# Patient Record
Sex: Male | Born: 1935 | Race: White | Hispanic: No | Marital: Married | Smoking: Never smoker
Health system: Southern US, Community
[De-identification: ages and names within clinical notes are randomized; demographics above are authoritative.]

## PROBLEM LIST (undated history)

## (undated) DIAGNOSIS — N400 Enlarged prostate without lower urinary tract symptoms: Secondary | ICD-10-CM

## (undated) DIAGNOSIS — R911 Solitary pulmonary nodule: Secondary | ICD-10-CM

## (undated) DIAGNOSIS — M1990 Unspecified osteoarthritis, unspecified site: Secondary | ICD-10-CM

## (undated) DIAGNOSIS — E785 Hyperlipidemia, unspecified: Secondary | ICD-10-CM

## (undated) DIAGNOSIS — U071 COVID-19: Secondary | ICD-10-CM

## (undated) HISTORY — PX: HERNIA REPAIR: SHX51

---

## 2014-07-01 DIAGNOSIS — I10 Essential (primary) hypertension: Secondary | ICD-10-CM | POA: Diagnosis not present

## 2014-07-01 DIAGNOSIS — E785 Hyperlipidemia, unspecified: Secondary | ICD-10-CM | POA: Diagnosis not present

## 2014-07-01 DIAGNOSIS — Z6823 Body mass index (BMI) 23.0-23.9, adult: Secondary | ICD-10-CM | POA: Diagnosis not present

## 2014-07-04 DIAGNOSIS — I050 Rheumatic mitral stenosis: Secondary | ICD-10-CM | POA: Diagnosis not present

## 2014-07-04 DIAGNOSIS — I361 Nonrheumatic tricuspid (valve) insufficiency: Secondary | ICD-10-CM | POA: Diagnosis not present

## 2014-07-04 DIAGNOSIS — I351 Nonrheumatic aortic (valve) insufficiency: Secondary | ICD-10-CM | POA: Diagnosis not present

## 2014-12-08 DIAGNOSIS — Z23 Encounter for immunization: Secondary | ICD-10-CM | POA: Diagnosis not present

## 2014-12-30 DIAGNOSIS — H2513 Age-related nuclear cataract, bilateral: Secondary | ICD-10-CM | POA: Diagnosis not present

## 2015-01-02 DIAGNOSIS — Z6823 Body mass index (BMI) 23.0-23.9, adult: Secondary | ICD-10-CM | POA: Diagnosis not present

## 2015-01-02 DIAGNOSIS — I050 Rheumatic mitral stenosis: Secondary | ICD-10-CM | POA: Diagnosis not present

## 2015-01-02 DIAGNOSIS — N401 Enlarged prostate with lower urinary tract symptoms: Secondary | ICD-10-CM | POA: Diagnosis not present

## 2015-01-02 DIAGNOSIS — I10 Essential (primary) hypertension: Secondary | ICD-10-CM | POA: Diagnosis not present

## 2015-01-02 DIAGNOSIS — E785 Hyperlipidemia, unspecified: Secondary | ICD-10-CM | POA: Diagnosis not present

## 2015-01-05 DIAGNOSIS — Z1211 Encounter for screening for malignant neoplasm of colon: Secondary | ICD-10-CM | POA: Diagnosis not present

## 2015-07-01 DIAGNOSIS — N400 Enlarged prostate without lower urinary tract symptoms: Secondary | ICD-10-CM | POA: Diagnosis not present

## 2015-07-01 DIAGNOSIS — M129 Arthropathy, unspecified: Secondary | ICD-10-CM | POA: Diagnosis not present

## 2015-07-01 DIAGNOSIS — I10 Essential (primary) hypertension: Secondary | ICD-10-CM | POA: Diagnosis not present

## 2015-07-01 DIAGNOSIS — E785 Hyperlipidemia, unspecified: Secondary | ICD-10-CM | POA: Diagnosis not present

## 2015-07-01 DIAGNOSIS — I050 Rheumatic mitral stenosis: Secondary | ICD-10-CM | POA: Diagnosis not present

## 2015-07-08 DIAGNOSIS — M5136 Other intervertebral disc degeneration, lumbar region: Secondary | ICD-10-CM | POA: Diagnosis not present

## 2015-07-20 DIAGNOSIS — K4090 Unilateral inguinal hernia, without obstruction or gangrene, not specified as recurrent: Secondary | ICD-10-CM | POA: Diagnosis not present

## 2015-07-20 DIAGNOSIS — Z6822 Body mass index (BMI) 22.0-22.9, adult: Secondary | ICD-10-CM | POA: Diagnosis not present

## 2015-12-21 DIAGNOSIS — R6889 Other general symptoms and signs: Secondary | ICD-10-CM | POA: Diagnosis not present

## 2015-12-24 DIAGNOSIS — Z23 Encounter for immunization: Secondary | ICD-10-CM | POA: Diagnosis not present

## 2016-01-06 DIAGNOSIS — E785 Hyperlipidemia, unspecified: Secondary | ICD-10-CM | POA: Diagnosis not present

## 2016-01-06 DIAGNOSIS — N401 Enlarged prostate with lower urinary tract symptoms: Secondary | ICD-10-CM | POA: Diagnosis not present

## 2016-01-06 DIAGNOSIS — I10 Essential (primary) hypertension: Secondary | ICD-10-CM | POA: Diagnosis not present

## 2016-01-13 DIAGNOSIS — H2513 Age-related nuclear cataract, bilateral: Secondary | ICD-10-CM | POA: Diagnosis not present

## 2016-01-21 DIAGNOSIS — N401 Enlarged prostate with lower urinary tract symptoms: Secondary | ICD-10-CM | POA: Diagnosis not present

## 2016-01-21 DIAGNOSIS — R972 Elevated prostate specific antigen [PSA]: Secondary | ICD-10-CM | POA: Diagnosis not present

## 2016-03-14 DIAGNOSIS — I2699 Other pulmonary embolism without acute cor pulmonale: Secondary | ICD-10-CM

## 2016-03-14 HISTORY — DX: Other pulmonary embolism without acute cor pulmonale: I26.99

## 2016-08-08 DIAGNOSIS — M25511 Pain in right shoulder: Secondary | ICD-10-CM | POA: Diagnosis not present

## 2016-08-09 DIAGNOSIS — M25511 Pain in right shoulder: Secondary | ICD-10-CM | POA: Diagnosis not present

## 2017-05-03 DIAGNOSIS — N401 Enlarged prostate with lower urinary tract symptoms: Secondary | ICD-10-CM | POA: Diagnosis not present

## 2017-05-03 DIAGNOSIS — R972 Elevated prostate specific antigen [PSA]: Secondary | ICD-10-CM | POA: Diagnosis not present

## 2017-05-08 DIAGNOSIS — J018 Other acute sinusitis: Secondary | ICD-10-CM | POA: Diagnosis not present

## 2017-07-10 DIAGNOSIS — N401 Enlarged prostate with lower urinary tract symptoms: Secondary | ICD-10-CM | POA: Diagnosis not present

## 2017-07-10 DIAGNOSIS — E782 Mixed hyperlipidemia: Secondary | ICD-10-CM | POA: Diagnosis not present

## 2017-07-10 DIAGNOSIS — M479 Spondylosis, unspecified: Secondary | ICD-10-CM | POA: Diagnosis not present

## 2017-07-10 DIAGNOSIS — I10 Essential (primary) hypertension: Secondary | ICD-10-CM | POA: Diagnosis not present

## 2017-07-10 DIAGNOSIS — E785 Hyperlipidemia, unspecified: Secondary | ICD-10-CM | POA: Diagnosis not present

## 2017-07-10 DIAGNOSIS — M5136 Other intervertebral disc degeneration, lumbar region: Secondary | ICD-10-CM | POA: Diagnosis not present

## 2017-07-20 DIAGNOSIS — I714 Abdominal aortic aneurysm, without rupture: Secondary | ICD-10-CM | POA: Diagnosis not present

## 2017-10-31 DIAGNOSIS — R972 Elevated prostate specific antigen [PSA]: Secondary | ICD-10-CM | POA: Diagnosis not present

## 2017-10-31 DIAGNOSIS — N401 Enlarged prostate with lower urinary tract symptoms: Secondary | ICD-10-CM | POA: Diagnosis not present

## 2017-11-10 DIAGNOSIS — Z23 Encounter for immunization: Secondary | ICD-10-CM | POA: Diagnosis not present

## 2017-11-10 DIAGNOSIS — I10 Essential (primary) hypertension: Secondary | ICD-10-CM | POA: Diagnosis not present

## 2017-11-10 DIAGNOSIS — M5136 Other intervertebral disc degeneration, lumbar region: Secondary | ICD-10-CM | POA: Diagnosis not present

## 2017-11-10 DIAGNOSIS — E782 Mixed hyperlipidemia: Secondary | ICD-10-CM | POA: Diagnosis not present

## 2017-12-06 DIAGNOSIS — H2513 Age-related nuclear cataract, bilateral: Secondary | ICD-10-CM | POA: Diagnosis not present

## 2018-03-21 DIAGNOSIS — I10 Essential (primary) hypertension: Secondary | ICD-10-CM | POA: Diagnosis not present

## 2018-03-21 DIAGNOSIS — E782 Mixed hyperlipidemia: Secondary | ICD-10-CM | POA: Diagnosis not present

## 2018-03-21 DIAGNOSIS — Z6823 Body mass index (BMI) 23.0-23.9, adult: Secondary | ICD-10-CM | POA: Diagnosis not present

## 2018-03-21 DIAGNOSIS — M5136 Other intervertebral disc degeneration, lumbar region: Secondary | ICD-10-CM | POA: Diagnosis not present

## 2018-07-20 DIAGNOSIS — E782 Mixed hyperlipidemia: Secondary | ICD-10-CM | POA: Diagnosis not present

## 2018-07-20 DIAGNOSIS — I10 Essential (primary) hypertension: Secondary | ICD-10-CM | POA: Diagnosis not present

## 2018-07-20 DIAGNOSIS — M5136 Other intervertebral disc degeneration, lumbar region: Secondary | ICD-10-CM | POA: Diagnosis not present

## 2018-07-20 DIAGNOSIS — I342 Nonrheumatic mitral (valve) stenosis: Secondary | ICD-10-CM | POA: Diagnosis not present

## 2018-07-24 DIAGNOSIS — I714 Abdominal aortic aneurysm, without rupture: Secondary | ICD-10-CM | POA: Diagnosis not present

## 2018-11-13 DIAGNOSIS — I10 Essential (primary) hypertension: Secondary | ICD-10-CM | POA: Diagnosis not present

## 2018-11-13 DIAGNOSIS — E782 Mixed hyperlipidemia: Secondary | ICD-10-CM | POA: Diagnosis not present

## 2018-11-13 DIAGNOSIS — I714 Abdominal aortic aneurysm, without rupture: Secondary | ICD-10-CM | POA: Diagnosis not present

## 2018-11-13 DIAGNOSIS — M5136 Other intervertebral disc degeneration, lumbar region: Secondary | ICD-10-CM | POA: Diagnosis not present

## 2018-12-25 DIAGNOSIS — H2513 Age-related nuclear cataract, bilateral: Secondary | ICD-10-CM | POA: Diagnosis not present

## 2019-02-13 DIAGNOSIS — R509 Fever, unspecified: Secondary | ICD-10-CM | POA: Diagnosis not present

## 2019-02-13 DIAGNOSIS — R05 Cough: Secondary | ICD-10-CM | POA: Diagnosis not present

## 2019-02-13 DIAGNOSIS — M791 Myalgia, unspecified site: Secondary | ICD-10-CM | POA: Diagnosis not present

## 2019-02-13 DIAGNOSIS — Z1159 Encounter for screening for other viral diseases: Secondary | ICD-10-CM | POA: Diagnosis not present

## 2019-02-17 DIAGNOSIS — R918 Other nonspecific abnormal finding of lung field: Secondary | ICD-10-CM | POA: Diagnosis not present

## 2019-02-17 DIAGNOSIS — R069 Unspecified abnormalities of breathing: Secondary | ICD-10-CM | POA: Diagnosis not present

## 2019-02-17 DIAGNOSIS — J1289 Other viral pneumonia: Secondary | ICD-10-CM | POA: Diagnosis not present

## 2019-02-17 DIAGNOSIS — U071 COVID-19: Secondary | ICD-10-CM | POA: Diagnosis not present

## 2019-02-17 DIAGNOSIS — Z743 Need for continuous supervision: Secondary | ICD-10-CM | POA: Diagnosis not present

## 2019-02-17 DIAGNOSIS — J9601 Acute respiratory failure with hypoxia: Secondary | ICD-10-CM | POA: Diagnosis not present

## 2019-02-17 DIAGNOSIS — Z4682 Encounter for fitting and adjustment of non-vascular catheter: Secondary | ICD-10-CM | POA: Diagnosis not present

## 2019-02-17 DIAGNOSIS — R0902 Hypoxemia: Secondary | ICD-10-CM | POA: Diagnosis not present

## 2019-02-17 DIAGNOSIS — R0602 Shortness of breath: Secondary | ICD-10-CM | POA: Diagnosis not present

## 2019-02-18 ENCOUNTER — Inpatient Hospital Stay (HOSPITAL_COMMUNITY): Payer: Medicare Other

## 2019-02-18 ENCOUNTER — Inpatient Hospital Stay (HOSPITAL_COMMUNITY)
Admission: AD | Admit: 2019-02-18 | Discharge: 2019-03-15 | DRG: 207 | Disposition: E | Payer: Medicare Other | Source: Other Acute Inpatient Hospital | Attending: Pulmonary Disease | Admitting: Pulmonary Disease

## 2019-02-18 ENCOUNTER — Encounter (HOSPITAL_COMMUNITY): Payer: Self-pay | Admitting: Pulmonary Disease

## 2019-02-18 DIAGNOSIS — E785 Hyperlipidemia, unspecified: Secondary | ICD-10-CM | POA: Diagnosis not present

## 2019-02-18 DIAGNOSIS — I4892 Unspecified atrial flutter: Secondary | ICD-10-CM | POA: Diagnosis not present

## 2019-02-18 DIAGNOSIS — E870 Hyperosmolality and hypernatremia: Secondary | ICD-10-CM | POA: Diagnosis not present

## 2019-02-18 DIAGNOSIS — J80 Acute respiratory distress syndrome: Secondary | ICD-10-CM | POA: Diagnosis not present

## 2019-02-18 DIAGNOSIS — I472 Ventricular tachycardia: Secondary | ICD-10-CM | POA: Diagnosis not present

## 2019-02-18 DIAGNOSIS — R911 Solitary pulmonary nodule: Secondary | ICD-10-CM | POA: Diagnosis present

## 2019-02-18 DIAGNOSIS — R6521 Severe sepsis with septic shock: Secondary | ICD-10-CM | POA: Diagnosis not present

## 2019-02-18 DIAGNOSIS — Z86711 Personal history of pulmonary embolism: Secondary | ICD-10-CM | POA: Diagnosis not present

## 2019-02-18 DIAGNOSIS — A419 Sepsis, unspecified organism: Secondary | ICD-10-CM | POA: Diagnosis not present

## 2019-02-18 DIAGNOSIS — R339 Retention of urine, unspecified: Secondary | ICD-10-CM | POA: Diagnosis not present

## 2019-02-18 DIAGNOSIS — A4189 Other specified sepsis: Secondary | ICD-10-CM | POA: Diagnosis not present

## 2019-02-18 DIAGNOSIS — E875 Hyperkalemia: Secondary | ICD-10-CM | POA: Diagnosis not present

## 2019-02-18 DIAGNOSIS — J939 Pneumothorax, unspecified: Secondary | ICD-10-CM | POA: Diagnosis not present

## 2019-02-18 DIAGNOSIS — T380X5A Adverse effect of glucocorticoids and synthetic analogues, initial encounter: Secondary | ICD-10-CM | POA: Diagnosis not present

## 2019-02-18 DIAGNOSIS — Z880 Allergy status to penicillin: Secondary | ICD-10-CM | POA: Diagnosis not present

## 2019-02-18 DIAGNOSIS — Z66 Do not resuscitate: Secondary | ICD-10-CM | POA: Diagnosis not present

## 2019-02-18 DIAGNOSIS — R34 Anuria and oliguria: Secondary | ICD-10-CM | POA: Diagnosis not present

## 2019-02-18 DIAGNOSIS — R739 Hyperglycemia, unspecified: Secondary | ICD-10-CM | POA: Diagnosis not present

## 2019-02-18 DIAGNOSIS — Y95 Nosocomial condition: Secondary | ICD-10-CM | POA: Diagnosis not present

## 2019-02-18 DIAGNOSIS — J9601 Acute respiratory failure with hypoxia: Secondary | ICD-10-CM

## 2019-02-18 DIAGNOSIS — J181 Lobar pneumonia, unspecified organism: Secondary | ICD-10-CM | POA: Diagnosis not present

## 2019-02-18 DIAGNOSIS — Z9911 Dependence on respirator [ventilator] status: Secondary | ICD-10-CM | POA: Diagnosis not present

## 2019-02-18 DIAGNOSIS — J1289 Other viral pneumonia: Secondary | ICD-10-CM

## 2019-02-18 DIAGNOSIS — Z4682 Encounter for fitting and adjustment of non-vascular catheter: Secondary | ICD-10-CM | POA: Diagnosis not present

## 2019-02-18 DIAGNOSIS — J156 Pneumonia due to other aerobic Gram-negative bacteria: Secondary | ICD-10-CM | POA: Diagnosis not present

## 2019-02-18 DIAGNOSIS — Z452 Encounter for adjustment and management of vascular access device: Secondary | ICD-10-CM | POA: Diagnosis not present

## 2019-02-18 DIAGNOSIS — R001 Bradycardia, unspecified: Secondary | ICD-10-CM | POA: Diagnosis not present

## 2019-02-18 DIAGNOSIS — R918 Other nonspecific abnormal finding of lung field: Secondary | ICD-10-CM | POA: Diagnosis not present

## 2019-02-18 DIAGNOSIS — I952 Hypotension due to drugs: Secondary | ICD-10-CM | POA: Diagnosis not present

## 2019-02-18 DIAGNOSIS — Z7189 Other specified counseling: Secondary | ICD-10-CM

## 2019-02-18 DIAGNOSIS — U071 COVID-19: Principal | ICD-10-CM | POA: Diagnosis present

## 2019-02-18 DIAGNOSIS — Z515 Encounter for palliative care: Secondary | ICD-10-CM | POA: Diagnosis not present

## 2019-02-18 DIAGNOSIS — N400 Enlarged prostate without lower urinary tract symptoms: Secondary | ICD-10-CM | POA: Diagnosis present

## 2019-02-18 HISTORY — DX: Benign prostatic hyperplasia without lower urinary tract symptoms: N40.0

## 2019-02-18 HISTORY — DX: Solitary pulmonary nodule: R91.1

## 2019-02-18 HISTORY — DX: COVID-19: U07.1

## 2019-02-18 HISTORY — DX: Hyperlipidemia, unspecified: E78.5

## 2019-02-18 HISTORY — DX: Unspecified osteoarthritis, unspecified site: M19.90

## 2019-02-18 LAB — HEPATITIS PANEL, ACUTE
HCV Ab: NONREACTIVE
Hep A IgM: NONREACTIVE
Hep B C IgM: NONREACTIVE
Hepatitis B Surface Ag: NONREACTIVE

## 2019-02-18 LAB — POCT I-STAT 7, (LYTES, BLD GAS, ICA,H+H)
Bicarbonate: 27 mmol/L (ref 20.0–28.0)
Calcium, Ion: 1.22 mmol/L (ref 1.15–1.40)
HCT: 37 % — ABNORMAL LOW (ref 39.0–52.0)
Hemoglobin: 12.6 g/dL — ABNORMAL LOW (ref 13.0–17.0)
O2 Saturation: 98 %
Patient temperature: 98.3
Potassium: 4.8 mmol/L (ref 3.5–5.1)
Sodium: 144 mmol/L (ref 135–145)
TCO2: 29 mmol/L (ref 22–32)
pCO2 arterial: 54.4 mmHg — ABNORMAL HIGH (ref 32.0–48.0)
pH, Arterial: 7.302 — ABNORMAL LOW (ref 7.350–7.450)
pO2, Arterial: 110 mmHg — ABNORMAL HIGH (ref 83.0–108.0)

## 2019-02-18 LAB — TROPONIN I (HIGH SENSITIVITY)
Troponin I (High Sensitivity): 229 ng/L (ref <18)
Troponin I (High Sensitivity): 229 ng/L (ref <18)

## 2019-02-18 LAB — FERRITIN: Ferritin: 672 ng/mL — ABNORMAL HIGH (ref 24–336)

## 2019-02-18 LAB — COMPREHENSIVE METABOLIC PANEL
ALT: 19 U/L (ref 0–44)
AST: 39 U/L (ref 15–41)
Albumin: 3 g/dL — ABNORMAL LOW (ref 3.5–5.0)
Alkaline Phosphatase: 40 U/L (ref 38–126)
Anion gap: 11 (ref 5–15)
BUN: 21 mg/dL (ref 8–23)
CO2: 28 mmol/L (ref 22–32)
Calcium: 8.5 mg/dL — ABNORMAL LOW (ref 8.9–10.3)
Chloride: 108 mmol/L (ref 98–111)
Creatinine, Ser: 1.16 mg/dL (ref 0.61–1.24)
GFR calc Af Amer: >60 mL/min (ref >60)
GFR calc non Af Amer: 57 mL/min — ABNORMAL LOW (ref >60)
Glucose, Bld: 129 mg/dL — ABNORMAL HIGH (ref 70–99)
Potassium: 4.9 mmol/L (ref 3.5–5.1)
Sodium: 147 mmol/L — ABNORMAL HIGH (ref 135–145)
Total Bilirubin: 0.5 mg/dL (ref 0.3–1.2)
Total Protein: 6.6 g/dL (ref 6.5–8.1)

## 2019-02-18 LAB — CBC WITH DIFFERENTIAL/PLATELET
Abs Immature Granulocytes: 0.04 10*3/uL (ref 0.00–0.07)
Basophils Absolute: 0 10*3/uL (ref 0.0–0.1)
Basophils Relative: 0 %
Eosinophils Absolute: 0 10*3/uL (ref 0.0–0.5)
Eosinophils Relative: 0 %
HCT: 41.5 % (ref 39.0–52.0)
Hemoglobin: 13.1 g/dL (ref 13.0–17.0)
Immature Granulocytes: 1 %
Lymphocytes Relative: 7 %
Lymphs Abs: 0.5 10*3/uL — ABNORMAL LOW (ref 0.7–4.0)
MCH: 29.8 pg (ref 26.0–34.0)
MCHC: 31.6 g/dL (ref 30.0–36.0)
MCV: 94.5 fL (ref 80.0–100.0)
Monocytes Absolute: 0.5 10*3/uL (ref 0.1–1.0)
Monocytes Relative: 6 %
Neutro Abs: 6.3 10*3/uL (ref 1.7–7.7)
Neutrophils Relative %: 86 %
Platelets: 269 10*3/uL (ref 150–400)
RBC: 4.39 MIL/uL (ref 4.22–5.81)
RDW: 13.3 % (ref 11.5–15.5)
WBC: 7.3 10*3/uL (ref 4.0–10.5)
nRBC: 0 % (ref 0.0–0.2)

## 2019-02-18 LAB — D-DIMER, QUANTITATIVE: D-Dimer, Quant: 0.96 ug/mL-FEU — ABNORMAL HIGH (ref 0.00–0.50)

## 2019-02-18 LAB — HEMOGLOBIN A1C
Hgb A1c MFr Bld: 6.2 % — ABNORMAL HIGH (ref 4.8–5.6)
Mean Plasma Glucose: 131.24 mg/dL

## 2019-02-18 LAB — FIBRINOGEN: Fibrinogen: 679 mg/dL — ABNORMAL HIGH (ref 210–475)

## 2019-02-18 LAB — PROCALCITONIN: Procalcitonin: 0.24 ng/mL

## 2019-02-18 LAB — GLUCOSE, CAPILLARY
Glucose-Capillary: 119 mg/dL — ABNORMAL HIGH (ref 70–99)
Glucose-Capillary: 113 mg/dL — ABNORMAL HIGH (ref 70–99)
Glucose-Capillary: 139 mg/dL — ABNORMAL HIGH (ref 70–99)

## 2019-02-18 LAB — MAGNESIUM: Magnesium: 1.7 mg/dL (ref 1.7–2.4)

## 2019-02-18 LAB — ABO/RH: ABO/RH(D): A POS

## 2019-02-18 LAB — PHOSPHORUS: Phosphorus: 3.8 mg/dL (ref 2.5–4.6)

## 2019-02-18 MED ORDER — INSULIN ASPART 100 UNIT/ML ~~LOC~~ SOLN
0.0000 [IU] | SUBCUTANEOUS | Status: DC
  Administered 2019-02-19 (×3): 1 [IU] via SUBCUTANEOUS
  Administered 2019-02-19: 2 [IU] via SUBCUTANEOUS
  Administered 2019-02-19: 1 [IU] via SUBCUTANEOUS
  Administered 2019-02-19 – 2019-02-20 (×4): 2 [IU] via SUBCUTANEOUS
  Administered 2019-02-20: 1 [IU] via SUBCUTANEOUS
  Administered 2019-02-21: 2 [IU] via SUBCUTANEOUS
  Administered 2019-02-21 (×2): 1 [IU] via SUBCUTANEOUS
  Administered 2019-02-21 – 2019-02-22 (×3): 2 [IU] via SUBCUTANEOUS
  Administered 2019-02-22: 1 [IU] via SUBCUTANEOUS
  Administered 2019-02-22: 3 [IU] via SUBCUTANEOUS
  Administered 2019-02-22: 2 [IU] via SUBCUTANEOUS
  Administered 2019-02-22: 3 [IU] via SUBCUTANEOUS
  Administered 2019-02-22: 1 [IU] via SUBCUTANEOUS
  Administered 2019-02-23: 2 [IU] via SUBCUTANEOUS
  Administered 2019-02-23: 5 [IU] via SUBCUTANEOUS
  Administered 2019-02-23 (×3): 3 [IU] via SUBCUTANEOUS
  Administered 2019-02-24 – 2019-02-25 (×2): 1 [IU] via SUBCUTANEOUS
  Administered 2019-02-25: 2 [IU] via SUBCUTANEOUS
  Administered 2019-02-25: 1 [IU] via SUBCUTANEOUS
  Administered 2019-02-25 – 2019-02-26 (×3): 2 [IU] via SUBCUTANEOUS
  Administered 2019-02-26 (×2): 1 [IU] via SUBCUTANEOUS
  Administered 2019-02-26 – 2019-02-27 (×3): 2 [IU] via SUBCUTANEOUS
  Administered 2019-02-27: 3 [IU] via SUBCUTANEOUS
  Administered 2019-02-27: 2 [IU] via SUBCUTANEOUS
  Administered 2019-02-27: 1 [IU] via SUBCUTANEOUS
  Administered 2019-02-28 (×2): 2 [IU] via SUBCUTANEOUS
  Administered 2019-02-28 – 2019-03-01 (×2): 1 [IU] via SUBCUTANEOUS

## 2019-02-18 MED ORDER — CHLORHEXIDINE GLUCONATE 0.12% ORAL RINSE (MEDLINE KIT)
15.0000 mL | Freq: Two times a day (BID) | OROMUCOSAL | Status: DC
  Administered 2019-02-18 – 2019-03-01 (×22): 15 mL via OROMUCOSAL

## 2019-02-18 MED ORDER — ADULT MULTIVITAMIN LIQUID CH
15.0000 mL | Freq: Every day | ORAL | Status: DC
  Administered 2019-02-18 – 2019-02-28 (×11): 15 mL
  Filled 2019-02-18 (×11): qty 15

## 2019-02-18 MED ORDER — ENOXAPARIN SODIUM 40 MG/0.4ML ~~LOC~~ SOLN
40.0000 mg | Freq: Two times a day (BID) | SUBCUTANEOUS | Status: DC
  Administered 2019-02-18 – 2019-02-28 (×21): 40 mg via SUBCUTANEOUS
  Filled 2019-02-18 (×21): qty 0.4

## 2019-02-18 MED ORDER — ORAL CARE MOUTH RINSE
15.0000 mL | OROMUCOSAL | Status: DC
  Administered 2019-02-18 – 2019-03-01 (×107): 15 mL via OROMUCOSAL

## 2019-02-18 MED ORDER — FAMOTIDINE 40 MG/5ML PO SUSR
20.0000 mg | Freq: Two times a day (BID) | ORAL | Status: DC
  Administered 2019-02-18 – 2019-03-01 (×22): 20 mg
  Filled 2019-02-18 (×22): qty 2.5

## 2019-02-18 MED ORDER — ARTIFICIAL TEARS OPHTHALMIC OINT
1.0000 "application " | TOPICAL_OINTMENT | Freq: Three times a day (TID) | OPHTHALMIC | Status: DC
  Administered 2019-02-19 – 2019-03-01 (×26): 1 via OPHTHALMIC
  Filled 2019-02-18 (×6): qty 3.5

## 2019-02-18 MED ORDER — FENTANYL CITRATE (PF) 100 MCG/2ML IJ SOLN: 25.0000 ug | Freq: Once | INTRAMUSCULAR | Status: DC

## 2019-02-18 MED ORDER — DEXAMETHASONE SODIUM PHOSPHATE 10 MG/ML IJ SOLN
6.0000 mg | INTRAMUSCULAR | Status: AC
  Administered 2019-02-18 – 2019-02-27 (×10): 6 mg via INTRAVENOUS
  Filled 2019-02-18 (×10): qty 1

## 2019-02-18 MED ORDER — FENTANYL 2500MCG IN NS 250ML (10MCG/ML) PREMIX INFUSION
25.0000 ug/h | INTRAVENOUS | Status: DC
  Administered 2019-02-18: 100 ug/h via INTRAVENOUS
  Administered 2019-02-19: 225 ug/h via INTRAVENOUS
  Administered 2019-02-20 – 2019-02-21 (×4): 200 ug/h via INTRAVENOUS
  Administered 2019-02-22: 275 ug/h via INTRAVENOUS
  Administered 2019-02-22: 200 ug/h via INTRAVENOUS
  Administered 2019-02-22 – 2019-02-23 (×2): 275 ug/h via INTRAVENOUS
  Administered 2019-02-23: 200 ug/h via INTRAVENOUS
  Administered 2019-02-23: 27.5 ug/h via INTRAVENOUS
  Administered 2019-02-23: 250 ug/h via INTRAVENOUS
  Administered 2019-02-24: 100 ug/h via INTRAVENOUS
  Administered 2019-02-25: 125 ug/h via INTRAVENOUS
  Administered 2019-02-25: 225 ug/h via INTRAVENOUS
  Administered 2019-02-26: 09:00:00 150 ug/h via INTRAVENOUS
  Administered 2019-02-27: 200 ug/h via INTRAVENOUS
  Administered 2019-02-27: 300 ug/h via INTRAVENOUS
  Administered 2019-02-28: 21:00:00 350 ug/h via INTRAVENOUS
  Administered 2019-02-28 (×3): 300 ug/h via INTRAVENOUS
  Administered 2019-03-01: 05:00:00 350 ug/h via INTRAVENOUS
  Administered 2019-03-01: 12:00:00 400 ug/h via INTRAVENOUS
  Filled 2019-02-18 (×25): qty 250

## 2019-02-18 MED ORDER — FOLIC ACID 1 MG PO TABS
1.0000 mg | ORAL_TABLET | Freq: Every day | ORAL | Status: DC
  Administered 2019-02-19 – 2019-02-28 (×10): 1 mg
  Filled 2019-02-18 (×9): qty 1

## 2019-02-18 MED ORDER — SODIUM CHLORIDE 0.9 % IV SOLN: 200.0000 mg | Freq: Once | INTRAVENOUS | Status: DC

## 2019-02-18 MED ORDER — TOCILIZUMAB 400 MG/20ML IV SOLN
8.0000 mg/kg | Freq: Once | INTRAVENOUS | Status: AC
  Administered 2019-02-18: 726 mg via INTRAVENOUS
  Filled 2019-02-18: qty 36.3

## 2019-02-18 MED ORDER — ONDANSETRON HCL 4 MG PO TABS: 4.0000 mg | ORAL_TABLET | Freq: Four times a day (QID) | ORAL | Status: DC | PRN

## 2019-02-18 MED ORDER — SODIUM CHLORIDE 0.9 % IV SOLN: 250.0000 mL | INTRAVENOUS | Status: DC

## 2019-02-18 MED ORDER — MIDAZOLAM BOLUS VIA INFUSION
1.0000 mg | INTRAVENOUS | Status: DC | PRN
  Administered 2019-02-22 – 2019-02-25 (×3): 2 mg via INTRAVENOUS
  Filled 2019-02-18: qty 2

## 2019-02-18 MED ORDER — TOCILIZUMAB 400 MG/20ML IV SOLN
8.0000 mg/kg | Freq: Once | INTRAVENOUS | Status: DC
  Filled 2019-02-18: qty 36.3

## 2019-02-18 MED ORDER — VITAL HIGH PROTEIN PO LIQD
1000.0000 mL | ORAL | Status: DC
  Administered 2019-02-18: 1000 mL

## 2019-02-18 MED ORDER — VECURONIUM BROMIDE 10 MG IV SOLR
0.0800 mg/kg | INTRAVENOUS | Status: DC | PRN
  Administered 2019-02-19 – 2019-02-28 (×6): 7.3 mg via INTRAVENOUS
  Filled 2019-02-18 (×7): qty 10

## 2019-02-18 MED ORDER — HEPARIN SODIUM (PORCINE) 5000 UNIT/ML IJ SOLN: 5000.0000 [IU] | Freq: Three times a day (TID) | INTRAMUSCULAR | Status: DC

## 2019-02-18 MED ORDER — POLYETHYLENE GLYCOL 3350 17 G PO PACK: 17.0000 g | PACK | Freq: Every day | ORAL | Status: DC | PRN

## 2019-02-18 MED ORDER — SODIUM CHLORIDE 0.9% IV SOLUTION
Freq: Once | INTRAVENOUS | Status: AC
  Administered 2019-02-18: 19:00:00 via INTRAVENOUS

## 2019-02-18 MED ORDER — DOCUSATE SODIUM 50 MG/5ML PO LIQD
100.0000 mg | Freq: Two times a day (BID) | ORAL | Status: DC
  Administered 2019-02-18 – 2019-02-25 (×13): 100 mg
  Filled 2019-02-18 (×14): qty 10

## 2019-02-18 MED ORDER — FENOFIBRATE 160 MG PO TABS
160.0000 mg | ORAL_TABLET | Freq: Every day | ORAL | Status: DC
  Administered 2019-02-18 – 2019-02-28 (×11): 160 mg
  Filled 2019-02-18 (×12): qty 1

## 2019-02-18 MED ORDER — ACETAMINOPHEN 325 MG PO TABS: 650.0000 mg | ORAL_TABLET | Freq: Four times a day (QID) | ORAL | Status: DC | PRN

## 2019-02-18 MED ORDER — MIDAZOLAM 50MG/50ML (1MG/ML) PREMIX INFUSION
2.0000 mg/h | INTRAVENOUS | Status: DC
  Administered 2019-02-18: 2 mg/h via INTRAVENOUS
  Administered 2019-02-19: 4 mg/h via INTRAVENOUS
  Administered 2019-02-19: 2 mg/h via INTRAVENOUS
  Administered 2019-02-20: 5 mg/h via INTRAVENOUS
  Administered 2019-02-20: 3 mg/h via INTRAVENOUS
  Administered 2019-02-21: 4 mg/h via INTRAVENOUS
  Administered 2019-02-21: 5 mg/h via INTRAVENOUS
  Administered 2019-02-22: 7 mg/h via INTRAVENOUS
  Administered 2019-02-22: 5 mg/h via INTRAVENOUS
  Administered 2019-02-22 – 2019-02-23 (×2): 7 mg/h via INTRAVENOUS
  Administered 2019-02-23: 5 mg/h via INTRAVENOUS
  Administered 2019-02-23: 7 mg/h via INTRAVENOUS
  Administered 2019-02-24: 5 mg/h via INTRAVENOUS
  Administered 2019-02-25: 6 mg/h via INTRAVENOUS
  Administered 2019-02-25: 2 mg/h via INTRAVENOUS
  Administered 2019-02-26: 22:00:00 5 mg/h via INTRAVENOUS
  Administered 2019-02-26: 09:00:00 4 mg/h via INTRAVENOUS
  Administered 2019-02-27: 7 mg/h via INTRAVENOUS
  Administered 2019-02-27: 6 mg/h via INTRAVENOUS
  Administered 2019-02-27: 11:00:00 7 mg/h via INTRAVENOUS
  Administered 2019-02-28 – 2019-03-01 (×5): 8 mg/h via INTRAVENOUS
  Administered 2019-03-01: 10 mg/h via INTRAVENOUS
  Filled 2019-02-18 (×27): qty 50

## 2019-02-18 MED ORDER — FINASTERIDE 5 MG PO TABS
5.0000 mg | ORAL_TABLET | Freq: Every day | ORAL | Status: DC
  Administered 2019-02-18 – 2019-02-28 (×11): 5 mg via ORAL
  Filled 2019-02-18 (×12): qty 1

## 2019-02-18 MED ORDER — SODIUM CHLORIDE 0.9 % IV SOLN: 100.0000 mg | Freq: Every day | INTRAVENOUS | Status: DC

## 2019-02-18 MED ORDER — PHENYLEPHRINE HCL-NACL 10-0.9 MG/250ML-% IV SOLN
25.0000 ug/min | INTRAVENOUS | Status: DC
  Administered 2019-02-19: 25 ug/min via INTRAVENOUS
  Administered 2019-02-19: 50 ug/min via INTRAVENOUS
  Administered 2019-02-19: 20 ug/min via INTRAVENOUS
  Administered 2019-02-19: 40 ug/min via INTRAVENOUS
  Filled 2019-02-18 (×5): qty 250

## 2019-02-18 MED ORDER — ZINC SULFATE 220 (50 ZN) MG PO CAPS
220.0000 mg | ORAL_CAPSULE | Freq: Every day | ORAL | Status: DC
  Administered 2019-02-18 – 2019-02-28 (×11): 220 mg via ORAL
  Filled 2019-02-18 (×11): qty 1

## 2019-02-18 MED ORDER — ONDANSETRON HCL 4 MG/2ML IJ SOLN: 4.0000 mg | Freq: Four times a day (QID) | INTRAMUSCULAR | Status: DC | PRN

## 2019-02-18 MED ORDER — VITAMIN C 500 MG PO TABS
500.0000 mg | ORAL_TABLET | Freq: Every day | ORAL | Status: DC
  Administered 2019-02-18 – 2019-02-28 (×11): 500 mg via ORAL
  Filled 2019-02-18 (×10): qty 1

## 2019-02-18 MED ORDER — FENTANYL BOLUS VIA INFUSION
25.0000 ug | INTRAVENOUS | Status: DC | PRN
  Administered 2019-02-21 – 2019-02-25 (×8): 25 ug via INTRAVENOUS
  Filled 2019-02-18: qty 25

## 2019-02-18 MED ORDER — SIMVASTATIN 40 MG PO TABS
40.0000 mg | ORAL_TABLET | Freq: Every day | ORAL | Status: DC
  Administered 2019-02-18 – 2019-02-21 (×4): 40 mg
  Filled 2019-02-18 (×4): qty 1

## 2019-02-18 MED ORDER — VITAMIN B-1 100 MG PO TABS
100.0000 mg | ORAL_TABLET | Freq: Every day | ORAL | Status: DC
  Administered 2019-02-18 – 2019-02-28 (×11): 100 mg
  Filled 2019-02-18 (×11): qty 1

## 2019-02-18 MED ORDER — FENTANYL 2500MCG IN NS 250ML (10MCG/ML) PREMIX INFUSION: 25.0000 ug/h | INTRAVENOUS | Status: DC

## 2019-02-18 MED ORDER — PRO-STAT SUGAR FREE PO LIQD
30.0000 mL | Freq: Two times a day (BID) | ORAL | Status: DC
  Administered 2019-02-18 – 2019-02-19 (×2): 30 mL
  Filled 2019-02-18 (×2): qty 30

## 2019-02-18 MED ORDER — ADULT MULTIVITAMIN W/MINERALS CH: 1.0000 | ORAL_TABLET | Freq: Every day | ORAL | Status: DC

## 2019-02-18 MED ORDER — FENTANYL BOLUS VIA INFUSION
25.0000 ug | INTRAVENOUS | Status: DC | PRN
  Filled 2019-02-18: qty 25

## 2019-02-18 MED ORDER — SODIUM CHLORIDE 0.9% FLUSH
3.0000 mL | Freq: Two times a day (BID) | INTRAVENOUS | Status: DC
  Administered 2019-02-18 (×2): 3 mL via INTRAVENOUS
  Administered 2019-02-19: 10 mL via INTRAVENOUS
  Administered 2019-02-19: 3 mL via INTRAVENOUS
  Administered 2019-02-20: 10 mL via INTRAVENOUS
  Administered 2019-02-20 – 2019-03-01 (×17): 3 mL via INTRAVENOUS

## 2019-02-18 MED ORDER — DEXMEDETOMIDINE HCL IN NACL 400 MCG/100ML IV SOLN: 0.0000 ug/kg/h | INTRAVENOUS | Status: AC

## 2019-02-18 MED ORDER — SODIUM CHLORIDE 0.9 % IV SOLN
100.0000 mg | Freq: Every day | INTRAVENOUS | Status: AC
  Administered 2019-02-18 – 2019-02-21 (×4): 100 mg via INTRAVENOUS
  Filled 2019-02-18 (×4): qty 20

## 2019-02-18 NOTE — Progress Notes (Signed)
CRITICAL VALUE ALERT  Critical Value:  Troponin - 222  Date & Time Notied:  2019/03/03 2876  Provider Notified: Per patient's RN Colletta Maryland she will contact the doctor

## 2019-02-18 NOTE — H&P (Signed)
NAME:  JEDI CATALFAMO, MRN:  [id number], DOB:  April 12, 1930, LOS: 0 ADMISSION DATE:  02/28/2019, CONSULTATION DATE:  03/03/2019 REFERRING MD:  [facility], CHIEF COMPLAINT:  SOB   Brief History   85 year old male admitted 12/7 with respiratory distress in the setting of Covid 19 pneumonia.  Patient transferred from [facility] intubated, sedated.  History of present illness   85 year old male with a past medical history of BPH, osteoarthritis and right inguinal herniorrhaphy who was seen by his PCP on ~12/4 with complaints of generalized weakness, body aches, fevers, chills.  He was reportedly COVID positive at that time.  He called EMS on 12/6 for increasing shortness of breath, sweats, diarrhea, fevers, chills and body aches.  EMS evaluation found him to have room air saturations of 75% which improved to 100% on NRB.   At baseline, the patient lives at home with his wife.  He continues to [employer] the yard, works in the garden, cooks.  He is a never smoker.  He worked in the [employer] in [place].  His father was a heavy smoker.    Initial ER evaluation found the patient to be hypoxemic with increased work of breathing.  He was intubated for respiratory failure.  COVID sting was +12/7.  Labs-WBC 6.9, hemoglobin 14.2, platelet 272 PT 11.6, INR 1.1, NA 139, K4.8, CL 102, CO2 29, glucose 249, BUN 22, serum creatinine 1, troponin less than 0.01, BNP 1030, LDH 1182, CRP 270, PCT 0.23, lactic acid 2.4 cleared to 1.  Influenza A and B negative.  Chest x-ray read from radiology at [facility] demonstrated ET tube 3.1 cm above the carina, NG tube projecting over the stomach, bilateral interstitial and alveolar opacities concerning for multilobar pneumonia versus pulmonary edema.    Past Medical History  Osteoarthritis Hyperlipidemia BPH Right Herniorrhaphy  Pulmonary Embolism - 2018, large left PE, RV/LV ratio 1.3 suggestive of RH strain, fatty infiltration of liver, s/p 6  months anticoagulation LLL Pulmonary Nodule - seen on 2018 CTA chest, 54mm  COVID-19 - positive 12/5 at PCP   [facility] Events   12/07 Admit to [facility] from [place]:    Procedures:  ETT 12/7 >>   Significant Diagnostic Tests:   EKG 12/7 >>   Micro Data:  COVID 12/7 (OSH) >> positive  BCx2 12/7 >>  Tracheal Aspirate 12/7 >> Urinalysis 12/7 >>  Antimicrobials:    Interim history/subjective:  As above  Objective   Blood pressure (!) 89/57, pulse 65, temperature 98.3 F (36.8 C), temperature source Axillary, resp. rate 16, height 5\' 7"  (1.702 m), weight 90.7 kg, SpO2 96 %.    Vent Mode: PRVC FiO2 (%):  [90 %-100 %] 90 % Set Rate:  [16 bmp-20 bmp] 20 bmp Vt Set:  [500 mL-520 mL] 520 mL PEEP:  [10 cmH20] 10 cmH20 Plateau Pressure:  [25 cmH20] 25 cmH20  No intake or output data in the 24 hours ending 03/10/2019 1657 Filed Weights   03/02/2019 1500  Weight: 90.7 kg    Examination: General: Thin elderly male lying in bed on mechanical ventilation in no acute distress HEENT: MM pink/moist, ETT, anicteric, pupils 3 mm/reactive, lids/lashes normal Neuro: Sedate, opens eyes with physical stimulation, does not follow commands CV: s1s2 RRR, sinus rhythm on bedside monitor, no m/r/g PULM: Even/non-labored on mechanical ventilation, lungs bilaterally clear anterior diminished bases GI: soft, bsx4 active  Extremities: warm/dry, no edema  Skin: no rashes or lesions  Resolved Hospital Problem list  Assessment & Plan:   Acute Hypoxemic Respiratory Failure in the setting of COVID-19 Pneumonia Moderate ARDS P/F ratio 122 -low Vt ventilation 4-8cc/kg -goal plateau pressure <30, driving pressure <40 cm J8J -target PaO2 55-65, titrate PEEP/FiO2 per ARDS protocol  -P/F ratio <150, begin prone therapy protocol -goal CVP <4, diuresis as necessary -VAP prevention measures  -follow intermittent CXR  -decadron, remdesivir  -assess LFT's, acute  hepatitis panel  -out of window for possible benefit of convalescent plasma   Hypotension  Suspect sedation related  -sedation change as below -peripheral neo for MAP >65 if needed  -may need central access  -assess EKG  Need for Sedation secondary to Mechanical Ventilation  -PAD protocol  -RASS Goal -3 to -4 with vent synchrony  -PRN NMB > if NMB utilized, RASS Goal -4 to -5 -fentanyl / versed gtt with PF ratio, PRN NMB   Hyperlipidemia -continue home simvastatin, fenofibrate  BPH -continue home finsteride 5mg  HS   At Risk Malnutrition  -begin TF per Nutrition    Best practice:  Diet: TF  Pain/Anxiety/Delirium protocol (if indicated): yes VAP protocol (if indicated): yes  DVT prophylaxis: lovenox  GI prophylaxis: pepcid  Glucose control: SSI, sensitive scale  Mobility: BR  Code Status: Full Code  Family Communication: Wife - Jun Osment [phone] updated via phone 12/7 Disposition: ICU  Labs   CBC: Recent Labs  Lab 02/23/2019 1450 02/19/2019 1528  WBC 7.3  --   NEUTROABS 6.3  --   HGB 13.1 12.6*  HCT 41.5 37.0*  MCV 94.5  --   PLT 269  --     Basic Metabolic Panel: Recent Labs  Lab 02/21/2019 1450 02/22/2019 1528  NA 147* 144  K 4.9 4.8  CL 108  --   CO2 28  --   GLUCOSE 129*  --   BUN 21  --   CREATININE 1.16  --   CALCIUM 8.5*  --    GFR: Estimated Creatinine Clearance: 50 mL/min (by C-G formula based on SCr of 1.16 mg/dL). Recent Labs  Lab 02/26/2019 1450  WBC 7.3    Liver Function Tests: Recent Labs  Lab 02/19/2019 1450  AST 39  ALT 19  ALKPHOS 40  BILITOT 0.5  PROT 6.6  ALBUMIN 3.0*   No results for input(s): LIPASE, AMYLASE in the last 168 hours. No results for input(s): AMMONIA in the last 168 hours.  ABG    Component Value Date/Time   PHART 7.302 (L) 02/21/2019 1528   PCO2ART 54.4 (H) 03/08/2019 1528   PO2ART 110.0 (H) 02/13/2019 1528   HCO3 27.0 03/08/2019 1528   TCO2 29 02/23/2019 1528   O2SAT 98.0 03/06/2019 1528      Coagulation Profile: No results for input(s): INR, PROTIME in the last 168 hours.  Cardiac Enzymes: No results for input(s): CKTOTAL, CKMB, CKMBINDEX, TROPONINI in the last 168 hours.  HbA1C: No results found for: HGBA1C  CBG: Recent Labs  Lab 03/05/2019 1513  GLUCAP 119*    Review of Systems:   Unable to complete due to sedation/ mechanical ventilation.  Past Medical History  He,  has a past medical history of BPH (benign prostatic hyperplasia), COVID-19, Hyperlipidemia, and Osteoarthritis.   Surgical History    Past Surgical History:  Procedure Laterality Date  . HERNIA REPAIR       Social History   reports that he has never smoked. He does not have any smokeless tobacco history on file. He reports that he does not drink alcohol or  use drugs.   Family History   His family history is not on file.   Allergies Allergies  Allergen Reactions  . Penicillin G Other (See Comments)    Other reaction(s): Other (See Comments) Diarrhea     Home Medications  Prior to Admission medications   Not on File     Critical care time: 35 minutes     Canary BrimBrandi Vallory Oetken, MSN, NP-C [place] [facility] 03/13/2019, 4:57 PM   Please see [email or web address] for pager details.

## 2019-02-18 NOTE — Progress Notes (Signed)
Pt tx from [facility] for [name]. He got remdesivir loading dose at 0000 12/7 along with the dexamethasone 6mg .   ALT 19 Scr 1  Andrew Friedman, PharmD, [place], AAHIVP, CPP Infectious Disease Pharmacist 02/23/2019 3:12 PM

## 2019-02-18 NOTE — Progress Notes (Signed)
Versed gtt from previous house - wasted 60 ml with 2nd nurse, Jamesetta So, RN.  New versed gtt started prior to proning pt at 2315.

## 2019-02-18 NOTE — Procedures (Signed)
Central Venous Catheter Insertion Procedure Note Andrew Friedman [id number] Jul 21, 1930  Procedure: Insertion of Central Venous Catheter Indications: Assessment of intravascular volume  Procedure Details Consent: Risks of procedure as well as the alternatives and risks of each were explained to the (patient/caregiver).  Consent for procedure obtained. Time Out: Verified patient identification, verified procedure, site/side was marked, verified correct patient position, special equipment/implants available, medications/allergies/relevent history reviewed, required imaging and test results available.  Performed  Maximum sterile technique was used including antiseptics, cap, gloves, gown, hand hygiene, mask and sheet. Skin prep: Chlorhexidine; local anesthetic administered A antimicrobial bonded/coated triple lumen catheter was placed in the left subclavian vein using the Seldinger technique.  Ultrasound was used to verify the patency of the vein and for real time needle guidance.  Evaluation Blood flow good Complications: No apparent complications Patient did tolerate procedure well. Chest X-ray ordered to verify placement.  CXR: pending.  Andrew Friedman 02/24/2019, 5:30 PM

## 2019-02-18 NOTE — Progress Notes (Signed)
Pt ETT holder changed to cloth tape and secure for proning.  Pt flipped to prone position at 1658 without complication.

## 2019-02-19 ENCOUNTER — Other Ambulatory Visit: Payer: Self-pay

## 2019-02-19 ENCOUNTER — Encounter (HOSPITAL_COMMUNITY): Payer: Self-pay | Admitting: *Deleted

## 2019-02-19 ENCOUNTER — Inpatient Hospital Stay (HOSPITAL_COMMUNITY): Payer: Medicare Other

## 2019-02-19 DIAGNOSIS — U071 COVID-19: Secondary | ICD-10-CM | POA: Diagnosis not present

## 2019-02-19 DIAGNOSIS — J9601 Acute respiratory failure with hypoxia: Secondary | ICD-10-CM

## 2019-02-19 DIAGNOSIS — I952 Hypotension due to drugs: Secondary | ICD-10-CM | POA: Diagnosis not present

## 2019-02-19 DIAGNOSIS — J1289 Other viral pneumonia: Secondary | ICD-10-CM | POA: Diagnosis not present

## 2019-02-19 LAB — CBC WITH DIFFERENTIAL/PLATELET
Abs Immature Granulocytes: 0.02 10*3/uL (ref 0.00–0.07)
Basophils Absolute: 0 10*3/uL (ref 0.0–0.1)
Basophils Relative: 1 %
Eosinophils Absolute: 0 10*3/uL (ref 0.0–0.5)
Eosinophils Relative: 0 %
HCT: 38 % — ABNORMAL LOW (ref 39.0–52.0)
Hemoglobin: 12 g/dL — ABNORMAL LOW (ref 13.0–17.0)
Immature Granulocytes: 1 %
Lymphocytes Relative: 14 %
Lymphs Abs: 0.5 10*3/uL — ABNORMAL LOW (ref 0.7–4.0)
MCH: 29.6 pg (ref 26.0–34.0)
MCHC: 31.6 g/dL (ref 30.0–36.0)
MCV: 93.8 fL (ref 80.0–100.0)
Monocytes Absolute: 0.1 10*3/uL (ref 0.1–1.0)
Monocytes Relative: 4 %
Neutro Abs: 2.8 10*3/uL (ref 1.7–7.7)
Neutrophils Relative %: 80 %
Platelets: 301 10*3/uL (ref 150–400)
RBC: 4.05 MIL/uL — ABNORMAL LOW (ref 4.22–5.81)
RDW: 13.4 % (ref 11.5–15.5)
WBC: 3.4 10*3/uL — ABNORMAL LOW (ref 4.0–10.5)
nRBC: 0 % (ref 0.0–0.2)

## 2019-02-19 LAB — URINALYSIS, ROUTINE W REFLEX MICROSCOPIC
Bacteria, UA: NONE SEEN
Bilirubin Urine: NEGATIVE
Glucose, UA: NEGATIVE mg/dL
Hgb urine dipstick: NEGATIVE
Ketones, ur: NEGATIVE mg/dL
Leukocytes,Ua: NEGATIVE
Nitrite: NEGATIVE
Protein, ur: 30 mg/dL — AB
Specific Gravity, Urine: 1.028 (ref 1.005–1.030)
pH: 5 (ref 5.0–8.0)

## 2019-02-19 LAB — POCT I-STAT 7, (LYTES, BLD GAS, ICA,H+H)
Acid-Base Excess: 1 mmol/L (ref 0.0–2.0)
Bicarbonate: 26.5 mmol/L (ref 20.0–28.0)
Bicarbonate: 27.5 mmol/L (ref 20.0–28.0)
Calcium, Ion: 1.27 mmol/L (ref 1.15–1.40)
Calcium, Ion: 1.28 mmol/L (ref 1.15–1.40)
HCT: 38 % — ABNORMAL LOW (ref 39.0–52.0)
HCT: 34 % — ABNORMAL LOW (ref 39.0–52.0)
Hemoglobin: 12.9 g/dL — ABNORMAL LOW (ref 13.0–17.0)
Hemoglobin: 11.6 g/dL — ABNORMAL LOW (ref 13.0–17.0)
O2 Saturation: 97 %
O2 Saturation: 96 %
Patient temperature: 36.8
Patient temperature: 36.7
Potassium: 4.9 mmol/L (ref 3.5–5.1)
Potassium: 4.8 mmol/L (ref 3.5–5.1)
Sodium: 143 mmol/L (ref 135–145)
Sodium: 144 mmol/L (ref 135–145)
TCO2: 28 mmol/L (ref 22–32)
TCO2: 29 mmol/L (ref 22–32)
pCO2 arterial: 51.1 mmHg — ABNORMAL HIGH (ref 32.0–48.0)
pCO2 arterial: 51 mmHg — ABNORMAL HIGH (ref 32.0–48.0)
pH, Arterial: 7.322 — ABNORMAL LOW (ref 7.350–7.450)
pH, Arterial: 7.338 — ABNORMAL LOW (ref 7.350–7.450)
pO2, Arterial: 96 mmHg (ref 83.0–108.0)
pO2, Arterial: 90 mmHg (ref 83.0–108.0)

## 2019-02-19 LAB — GLUCOSE, CAPILLARY
Glucose-Capillary: 122 mg/dL — ABNORMAL HIGH (ref 70–99)
Glucose-Capillary: 124 mg/dL — ABNORMAL HIGH (ref 70–99)
Glucose-Capillary: 133 mg/dL — ABNORMAL HIGH (ref 70–99)
Glucose-Capillary: 150 mg/dL — ABNORMAL HIGH (ref 70–99)
Glucose-Capillary: 159 mg/dL — ABNORMAL HIGH (ref 70–99)
Glucose-Capillary: 171 mg/dL — ABNORMAL HIGH (ref 70–99)

## 2019-02-19 LAB — MAGNESIUM
Magnesium: 1.9 mg/dL (ref 1.7–2.4)
Magnesium: 2.1 mg/dL (ref 1.7–2.4)

## 2019-02-19 LAB — COMPREHENSIVE METABOLIC PANEL
ALT: 17 U/L (ref 0–44)
AST: 33 U/L (ref 15–41)
Albumin: 2.5 g/dL — ABNORMAL LOW (ref 3.5–5.0)
Alkaline Phosphatase: 38 U/L (ref 38–126)
Anion gap: 10 (ref 5–15)
BUN: 36 mg/dL — ABNORMAL HIGH (ref 8–23)
CO2: 28 mmol/L (ref 22–32)
Calcium: 8.8 mg/dL — ABNORMAL LOW (ref 8.9–10.3)
Chloride: 108 mmol/L (ref 98–111)
Creatinine, Ser: 1.19 mg/dL (ref 0.61–1.24)
GFR calc Af Amer: >60 mL/min (ref >60)
GFR calc non Af Amer: 55 mL/min — ABNORMAL LOW (ref >60)
Glucose, Bld: 194 mg/dL — ABNORMAL HIGH (ref 70–99)
Potassium: 5.1 mmol/L (ref 3.5–5.1)
Sodium: 146 mmol/L — ABNORMAL HIGH (ref 135–145)
Total Bilirubin: 0.3 mg/dL (ref 0.3–1.2)
Total Protein: 5.9 g/dL — ABNORMAL LOW (ref 6.5–8.1)

## 2019-02-19 LAB — PHOSPHORUS
Phosphorus: 3.2 mg/dL (ref 2.5–4.6)
Phosphorus: 3.8 mg/dL (ref 2.5–4.6)

## 2019-02-19 LAB — D-DIMER, QUANTITATIVE: D-Dimer, Quant: 2.03 ug/mL-FEU — ABNORMAL HIGH (ref 0.00–0.50)

## 2019-02-19 LAB — FERRITIN: Ferritin: 714 ng/mL — ABNORMAL HIGH (ref 24–336)

## 2019-02-19 LAB — TROPONIN I (HIGH SENSITIVITY): Troponin I (High Sensitivity): 67 ng/L — ABNORMAL HIGH (ref <18)

## 2019-02-19 LAB — MRSA PCR SCREENING: MRSA by PCR: NEGATIVE

## 2019-02-19 MED ORDER — SODIUM CHLORIDE 0.9% IV SOLUTION
Freq: Once | INTRAVENOUS | Status: AC
  Administered 2019-02-19: 10:00:00 via INTRAVENOUS

## 2019-02-19 MED ORDER — VITAL 1.5 CAL PO LIQD
1000.0000 mL | ORAL | Status: DC
  Administered 2019-02-19 – 2019-02-27 (×6): 1000 mL

## 2019-02-19 MED ORDER — ATROPINE SULFATE 1 MG/10ML IJ SOSY
PREFILLED_SYRINGE | INTRAMUSCULAR | Status: AC
  Administered 2019-02-19: 1 mg
  Filled 2019-02-19: qty 10

## 2019-02-19 MED ORDER — PRO-STAT SUGAR FREE PO LIQD
30.0000 mL | Freq: Three times a day (TID) | ORAL | Status: DC
  Administered 2019-02-19 – 2019-02-22 (×8): 30 mL
  Filled 2019-02-19 (×9): qty 30

## 2019-02-19 MED ORDER — ATROPINE SULFATE 1 MG/ML IJ SOLN
0.5000 mg | Freq: Four times a day (QID) | INTRAMUSCULAR | Status: DC | PRN
  Administered 2019-02-20: 0.5 mg via INTRAVENOUS
  Filled 2019-02-19 (×2): qty 0.5

## 2019-02-19 MED ORDER — CHLORHEXIDINE GLUCONATE CLOTH 2 % EX PADS
6.0000 | MEDICATED_PAD | Freq: Every day | CUTANEOUS | Status: DC
  Administered 2019-02-19 – 2019-03-01 (×11): 6 via TOPICAL

## 2019-02-19 NOTE — Progress Notes (Signed)
NAME:  Andrew Friedman, MRN:  [id number], DOB:  [birth year], LOS: 1 ADMISSION DATE:  03/03/2019, CONSULTATION DATE:  March 03, 2019 REFERRING MD:  [facility], CHIEF COMPLAINT:  SOB   Brief History   85 year old male admitted 12/7 with respiratory distress in the setting of Covid 19 pneumonia.  Patient transferred from [facility] intubated, sedated.  Past Medical History  Osteoarthritis Hyperlipidemia BPH Right Herniorrhaphy  Pulmonary Embolism - 2018, large left PE, RV/LV ratio 1.3 suggestive of RH strain, fatty infiltration of liver, s/p 6 months anticoagulation LLL Pulmonary Nodule - seen on 2018 CTA chest, 18mm  COVID-19 - positive 02/16/19 at PCP  Significant Hospital Events   12/07 Admit to [facility] from [facility]  12/08 Convalescent plasma, episodes of brady to 30's  Consults:    Procedures:  ETT 12/7 >>  L [facility] TLC 12/7 >>   Significant Diagnostic Tests:   EKG 12/7 >>   Micro Data:  COVID 12/7 (OSH) >> positive  BCx2 12/7 >>  Tracheal Aspirate 12/7 >> moderate GPC's in chains, few GNR >> Urinalysis 12/7 >> negative, mild proteinuria  Quantiferon Gold 12/7 >>  Acute Hepatitis Panel 12/7 >> negative   Antimicrobials:    Interim history/subjective:  RN reports pt intermittently bradycardic to 30's then spontaneously resolves.  Prone position. FiO2 to 80%, PEEP 12. Pplat 29, Peak 32    Objective   Blood pressure (!) 106/58, pulse (!) 58, temperature 97.7 F (36.5 C), resp. rate 20, height 5\' 10"  (1.778 m), weight 74.1 kg, SpO2 96 %.    Vent Mode: PRVC FiO2 (%):  [90 %-100 %] 100 % Set Rate:  [16 bmp-20 bmp] 20 bmp Vt Set:  [500 mL-520 mL] 520 mL PEEP:  [10 cmH20-12 cmH20] 12 cmH20 Plateau Pressure:  [25 cmH20-29 cmH20] 29 cmH20   Intake/Output Summary (Last 24 hours) at 02/19/2019 0957 Last data filed at 02/19/2019 14/10/2018 Gross per 24 hour  Intake 1378.09 ml  Output 430 ml  Net 948.09 ml   Filed Weights   March 03, 2019 1500 2019-03-03 1530 02/19/19  0449  Weight: 90.7 kg 95.3 kg 74.1 kg    Examination: General: thin elderly male lying in bed in NAD on vent, prone  HEENT: MM pink/moist, ETT Neuro: sedate  CV: s1s2 rrr, intermittent brief SB to high 30's, no m/r/g PULM:  Non-labored, lungs bilaterally coarse  GI: soft, bsx4 active  Extremities: warm/dry, no edema  Skin: no rashes or lesions  Resolved Hospital Problem list     Assessment & Plan:   Acute Hypoxemic Respiratory Failure in the setting of COVID-19 Pneumonia Moderate ARDS Received convalescent plasma 12/8.  -low Vt ventilation 4-8cc/kg -goal plateau pressure <30, driving pressure 14/8 cm <50 -target PaO2 55-65, titrate PEEP/FiO2 per ARDS protocol  -if P/F ratio <150, consider prone therapy for 16 hours per day -goal CVP <4, diuresis as necessary -VAP prevention measures  -follow intermittent CXR  -decadron, remdesivir -follow intermittent LFT's  -defer Actemera decision to MD  Hypotension  Bradycardia Suspect sedation related  -assess EKG  -repeat troponin cleared -neosynephrine for MAP >65  Need for Sedation secondary to Mechanical Ventilation  -PAD protocol  -RASS Goal -4 to -5 with vent synchrony  -PRN NMB > if NMB utilized, RASS goal -4 to -5 -fentanyl versed gtt  Hyperlipidemia -continue home simvastatin, fenofibrate  BPH -continue home finesteride 5mg  QHS  At Risk Malnutrition  -TF per Nutrition   Best practice:  Diet: TF  Pain/Anxiety/Delirium protocol (if indicated): yes VAP protocol (if indicated):  yes  DVT prophylaxis: lovenox  GI prophylaxis: pepcid  Glucose control: SSI, sensitive scale  Mobility: BR  Code Status: Full Code  Family Communication: Wife Filippo Puls [phone] updated via phone 12/8 Disposition: ICU  Labs   CBC: Recent Labs  Lab March 20, 2019 1450 03/20/19 1528 02/19/19 0355 02/19/19 0422  WBC 7.3  --  3.4*  --   NEUTROABS 6.3  --  2.8  --   HGB 13.1 12.6* 12.0* 12.9*  HCT 41.5 37.0* 38.0* 38.0*   MCV 94.5  --  93.8  --   PLT 269  --  301  --     Basic Metabolic Panel: Recent Labs  Lab Mar 20, 2019 1450 03-20-19 1528 02/19/19 0355 02/19/19 0422  NA 147* 144 146* 143  K 4.9 4.8 5.1 4.9  CL 108  --  108  --   CO2 28  --  28  --   GLUCOSE 129*  --  194*  --   BUN 21  --  36*  --   CREATININE 1.16  --  1.19  --   CALCIUM 8.5*  --  8.8*  --   MG 1.7  --  1.9  --   PHOS 3.8  --  3.2  --    GFR: Estimated Creatinine Clearance: 46.9 mL/min (by C-G formula based on SCr of 1.19 mg/dL). Recent Labs  Lab 03/20/2019 1450 02/19/19 0355  PROCALCITON 0.24  --   WBC 7.3 3.4*    Liver Function Tests: Recent Labs  Lab Mar 20, 2019 1450 02/19/19 0355  AST 39 33  ALT 19 17  ALKPHOS 40 38  BILITOT 0.5 0.3  PROT 6.6 5.9*  ALBUMIN 3.0* 2.5*   No results for input(s): LIPASE, AMYLASE in the last 168 hours. No results for input(s): AMMONIA in the last 168 hours.  ABG    Component Value Date/Time   PHART 7.322 (L) 02/19/2019 0422   PCO2ART 51.1 (H) 02/19/2019 0422   PO2ART 96.0 02/19/2019 0422   HCO3 26.5 02/19/2019 0422   TCO2 28 02/19/2019 0422   O2SAT 97.0 02/19/2019 0422     Coagulation Profile: No results for input(s): INR, PROTIME in the last 168 hours.  Cardiac Enzymes: No results for input(s): CKTOTAL, CKMB, CKMBINDEX, TROPONINI in the last 168 hours.  HbA1C: Hgb A1c MFr Bld  Date/Time Value Ref Range Status  03-20-19 02:50 PM 6.2 (H) 4.8 - 5.6 % Final    Comment:    (NOTE) Pre diabetes:          5.7%-6.4% Diabetes:              >6.4% Glycemic control for   <7.0% adults with diabetes     CBG: Recent Labs  Lab 03-20-2019 1810 03-20-19 2101 02/19/19 0003 02/19/19 0353 02/19/19 0728  GLUCAP 113* 139* 122* 171* 159*     Critical care time: 30 minutes     Noe Gens, MSN, NP-C [place] [facility] 02/19/2019, 9:57 AM   Please see [email or web address] for pager details.

## 2019-02-19 NOTE — Progress Notes (Signed)
Patient's wife called and provided full update.

## 2019-02-19 NOTE — Progress Notes (Signed)
Head repositioned. Arms rotated. ETT secure. Tolerated well.

## 2019-02-19 NOTE — Progress Notes (Signed)
Head repositioned to the right.Arms rotated.  ETT remains secured. Tolerated fairly well. No complications at this time.

## 2019-02-19 NOTE — Progress Notes (Signed)
Called and talked with pt's wife, Denzal.  Updated given - pt will be proned again tonight to help with his oxygen needs.  Pt stated that she did get tested today and awaiting her results.  No one at home while on quaratine but people checking in with her via phone.

## 2019-02-19 NOTE — Progress Notes (Signed)
Pt's head turned at this time w/o event. 

## 2019-02-19 NOTE — Progress Notes (Addendum)
Initial Nutrition Assessment  DOCUMENTATION CODES:   Not applicable  INTERVENTION:  D/C Vital High Protein  Initiate Vital 1.5 @ 40 ml/hr (total 960 ml/day) with 30 ml Prostat TID via OG tube  The above TF regimen provides 1740 kcal, 110 g protein and 779 ml free water.  NUTRITION DIAGNOSIS:   Increased nutrient needs related to acute illness(COVID-19 related Pneumonia) as evidenced by estimated needs.  GOAL:   Patient will meet greater than or equal to 90% of their needs  MONITOR:   Vent status, Labs, Weight trends, TF tolerance, Skin, I & O's  REASON FOR ASSESSMENT:   Consult, Ventilator Enteral/tube feeding initiation and management  ASSESSMENT:    85 y.o. M with PMH significant for PE, BPH, HLD admitted with COVID-19 related pneumonia. Intubated on 12/7.  Patient intubated and on ventilator support. Vent Mode: PRVC FiO2 (%):  [80 %-100 %] 80 % Set Rate:  [20 bmp] 20 bmp Vt Set:  [520 mL] 520 mL PEEP:  [10 MWN02-72 cmH20] 10 cmH20 Plateau Pressure:  [23 cmH20-29 cmH20] 23 cmH20  On ARDS Protocol, pt proned on 12/7 @ 2330.  Noted paralytics PRN for vent synchrony.  Medications reviewed include: decadron; Colace; Pepcid; Folic Acid; SSI; MVI; Thiamine; Vitamin C; Zinc; miralax, Neo @ 20 mcg  Labs reviewed and include:  CBGs: 113-159; Na 143; K 4.9; BUN 36 (H); MAP: 70 (65-91) UOP: 285 mL on 12/7 I&O: 1 L positive 16 fr OG tube per chest xray in good position.  TF: Vital High protein @ 40 ml/hr with 30 ml prostat BID via OG started 12/7 Provides 1160 kcal and 114 grams protein.  NUTRITION - FOCUSED PHYSICAL EXAM: Deferred; RD working Remotely.  Diet Order:   Diet Order            Diet NPO time specified  Diet effective now              EDUCATION NEEDS:   Not appropriate for education at this time  Skin:  Skin Assessment: Reviewed RN Assessment  Last BM:  unknown  Height:   Ht Readings from Last 1 Encounters:  02/19/19 5\' 10"  (1.778 m)     Weight:   Wt Readings from Last 1 Encounters:  02/19/19 74.1 kg    Ideal Body Weight:  75.4 kg  BMI:  Body mass index is 23.44 kg/m.  Estimated Nutritional Needs:   Kcal:  1660  Protein:  110-120 grams  Fluid:  1.6 L  Meda Klinefelter, Dietetic Intern

## 2019-02-19 NOTE — Progress Notes (Signed)
Pt unproned at 1635 and cloth tape and protective dressings removed for skin evaluation.  No noticeable issues on lips, inside mouth, chin, cheeks or forehead noted.  Resecured ET tube with [place].

## 2019-02-20 ENCOUNTER — Other Ambulatory Visit: Payer: Self-pay

## 2019-02-20 ENCOUNTER — Inpatient Hospital Stay (HOSPITAL_COMMUNITY): Payer: Medicare Other

## 2019-02-20 DIAGNOSIS — J9601 Acute respiratory failure with hypoxia: Secondary | ICD-10-CM | POA: Diagnosis not present

## 2019-02-20 DIAGNOSIS — R001 Bradycardia, unspecified: Secondary | ICD-10-CM

## 2019-02-20 DIAGNOSIS — I472 Ventricular tachycardia: Secondary | ICD-10-CM

## 2019-02-20 LAB — CBC WITH DIFFERENTIAL/PLATELET
Abs Immature Granulocytes: 0.04 10*3/uL (ref 0.00–0.07)
Basophils Absolute: 0 10*3/uL (ref 0.0–0.1)
Basophils Relative: 1 %
Eosinophils Absolute: 0 10*3/uL (ref 0.0–0.5)
Eosinophils Relative: 0 %
HCT: 38.3 % — ABNORMAL LOW (ref 39.0–52.0)
Hemoglobin: 11.8 g/dL — ABNORMAL LOW (ref 13.0–17.0)
Immature Granulocytes: 1 %
Lymphocytes Relative: 14 %
Lymphs Abs: 0.6 10*3/uL — ABNORMAL LOW (ref 0.7–4.0)
MCH: 29.3 pg (ref 26.0–34.0)
MCHC: 30.8 g/dL (ref 30.0–36.0)
MCV: 95 fL (ref 80.0–100.0)
Monocytes Absolute: 0.2 10*3/uL (ref 0.1–1.0)
Monocytes Relative: 4 %
Neutro Abs: 3.2 10*3/uL (ref 1.7–7.7)
Neutrophils Relative %: 80 %
Platelets: 327 10*3/uL (ref 150–400)
RBC: 4.03 MIL/uL — ABNORMAL LOW (ref 4.22–5.81)
RDW: 13.5 % (ref 11.5–15.5)
WBC: 4 10*3/uL (ref 4.0–10.5)
nRBC: 0 % (ref 0.0–0.2)

## 2019-02-20 LAB — POCT I-STAT 7, (LYTES, BLD GAS, ICA,H+H)
Bicarbonate: 27.4 mmol/L (ref 20.0–28.0)
Calcium, Ion: 1.34 mmol/L (ref 1.15–1.40)
HCT: 37 % — ABNORMAL LOW (ref 39.0–52.0)
Hemoglobin: 12.6 g/dL — ABNORMAL LOW (ref 13.0–17.0)
O2 Saturation: 79 %
Patient temperature: 36.9
Potassium: 5 mmol/L (ref 3.5–5.1)
Sodium: 144 mmol/L (ref 135–145)
TCO2: 29 mmol/L (ref 22–32)
pCO2 arterial: 55.7 mmHg — ABNORMAL HIGH (ref 32.0–48.0)
pH, Arterial: 7.299 — ABNORMAL LOW (ref 7.350–7.450)
pO2, Arterial: 48 mmHg — ABNORMAL LOW (ref 83.0–108.0)

## 2019-02-20 LAB — BASIC METABOLIC PANEL
Anion gap: 8 (ref 5–15)
BUN: 44 mg/dL — ABNORMAL HIGH (ref 8–23)
CO2: 31 mmol/L (ref 22–32)
Calcium: 8.9 mg/dL (ref 8.9–10.3)
Chloride: 108 mmol/L (ref 98–111)
Creatinine, Ser: 0.67 mg/dL (ref 0.61–1.24)
GFR calc Af Amer: >60 mL/min (ref >60)
GFR calc non Af Amer: >60 mL/min (ref >60)
Glucose, Bld: 144 mg/dL — ABNORMAL HIGH (ref 70–99)
Potassium: 4.7 mmol/L (ref 3.5–5.1)
Sodium: 147 mmol/L — ABNORMAL HIGH (ref 135–145)

## 2019-02-20 LAB — GLUCOSE, CAPILLARY
Glucose-Capillary: 103 mg/dL — ABNORMAL HIGH (ref 70–99)
Glucose-Capillary: 115 mg/dL — ABNORMAL HIGH (ref 70–99)
Glucose-Capillary: 146 mg/dL — ABNORMAL HIGH (ref 70–99)
Glucose-Capillary: 161 mg/dL — ABNORMAL HIGH (ref 70–99)
Glucose-Capillary: 186 mg/dL — ABNORMAL HIGH (ref 70–99)
Glucose-Capillary: 192 mg/dL — ABNORMAL HIGH (ref 70–99)

## 2019-02-20 LAB — QUANTIFERON-TB GOLD PLUS: QuantiFERON-TB Gold Plus: NEGATIVE

## 2019-02-20 LAB — QUANTIFERON-TB GOLD PLUS (RQFGPL)
QuantiFERON Mitogen Value: 1.08 IU/mL
QuantiFERON Nil Value: 0.06 IU/mL
QuantiFERON TB1 Ag Value: 0.09 IU/mL
QuantiFERON TB2 Ag Value: 0.07 IU/mL

## 2019-02-20 LAB — COMPREHENSIVE METABOLIC PANEL
ALT: 20 U/L (ref 0–44)
AST: 32 U/L (ref 15–41)
Albumin: 2.7 g/dL — ABNORMAL LOW (ref 3.5–5.0)
Alkaline Phosphatase: 43 U/L (ref 38–126)
Anion gap: 7 (ref 5–15)
BUN: 47 mg/dL — ABNORMAL HIGH (ref 8–23)
CO2: 30 mmol/L (ref 22–32)
Calcium: 9.1 mg/dL (ref 8.9–10.3)
Chloride: 107 mmol/L (ref 98–111)
Creatinine, Ser: 0.81 mg/dL (ref 0.61–1.24)
GFR calc Af Amer: >60 mL/min (ref >60)
GFR calc non Af Amer: >60 mL/min (ref >60)
Glucose, Bld: 235 mg/dL — ABNORMAL HIGH (ref 70–99)
Potassium: 5 mmol/L (ref 3.5–5.1)
Sodium: 144 mmol/L (ref 135–145)
Total Bilirubin: 0.7 mg/dL (ref 0.3–1.2)
Total Protein: 6 g/dL — ABNORMAL LOW (ref 6.5–8.1)

## 2019-02-20 LAB — PHOSPHORUS
Phosphorus: 3.6 mg/dL (ref 2.5–4.6)
Phosphorus: 4 mg/dL (ref 2.5–4.6)

## 2019-02-20 LAB — PREPARE FRESH FROZEN PLASMA: Unit division: 0

## 2019-02-20 LAB — BPAM FFP
Blood Product Expiration Date: 202012090458
ISSUE DATE / TIME: 202012080508
Unit Type and Rh: 6200

## 2019-02-20 LAB — FERRITIN: Ferritin: 674 ng/mL — ABNORMAL HIGH (ref 24–336)

## 2019-02-20 LAB — TROPONIN I (HIGH SENSITIVITY)
Troponin I (High Sensitivity): 85 ng/L — ABNORMAL HIGH (ref <18)
Troponin I (High Sensitivity): 76 ng/L — ABNORMAL HIGH (ref <18)

## 2019-02-20 LAB — ECHOCARDIOGRAM COMPLETE
Height: 70 in
Weight: 2663.16 oz

## 2019-02-20 LAB — MAGNESIUM
Magnesium: 2 mg/dL (ref 1.7–2.4)
Magnesium: 2.2 mg/dL (ref 1.7–2.4)

## 2019-02-20 LAB — D-DIMER, QUANTITATIVE: D-Dimer, Quant: 1.64 ug/mL-FEU — ABNORMAL HIGH (ref 0.00–0.50)

## 2019-02-20 MED ORDER — DEXTROSE 50 % IV SOLN
INTRAVENOUS | Status: AC
  Filled 2019-02-20: qty 50

## 2019-02-20 MED ORDER — CALCIUM GLUCONATE-NACL 1-0.675 GM/50ML-% IV SOLN
1.0000 g | Freq: Once | INTRAVENOUS | Status: AC
  Administered 2019-02-20: 1000 mg via INTRAVENOUS
  Filled 2019-02-20: qty 50

## 2019-02-20 MED ORDER — DOPAMINE-DEXTROSE 3.2-5 MG/ML-% IV SOLN
0.0000 ug/kg/min | INTRAVENOUS | Status: DC
  Administered 2019-02-22: 5 ug/kg/min via INTRAVENOUS
  Filled 2019-02-20 (×2): qty 250

## 2019-02-20 MED ORDER — ATROPINE SULFATE 1 MG/10ML IJ SOSY: 0.5000 mg | PREFILLED_SYRINGE | Freq: Once | INTRAMUSCULAR | Status: DC

## 2019-02-20 MED ORDER — DOPAMINE-DEXTROSE 3.2-5 MG/ML-% IV SOLN
1.5000 ug/kg/min | INTRAVENOUS | Status: DC
  Administered 2019-02-20: 1.5 ug/kg/min via INTRAVENOUS
  Filled 2019-02-20: qty 250

## 2019-02-20 MED ORDER — MAGNESIUM SULFATE 2 GM/50ML IV SOLN
2.0000 g | Freq: Once | INTRAVENOUS | Status: AC
  Administered 2019-02-20: 2 g via INTRAVENOUS
  Filled 2019-02-20: qty 50

## 2019-02-20 MED ORDER — ATROPINE SULFATE 1 MG/10ML IJ SOSY: 0.5000 mg | PREFILLED_SYRINGE | Freq: Four times a day (QID) | INTRAMUSCULAR | Status: DC | PRN

## 2019-02-20 NOTE — Progress Notes (Signed)
NAME:  Andrew Friedman, MRN:  [id number], DOB:  1930/09/05, LOS: 2 ADMISSION DATE:  03/06/2019, CONSULTATION DATE:  02/21/2019 REFERRING MD:  [facility], CHIEF COMPLAINT:  SOB   Brief History   85 year old male admitted 12/7 with respiratory distress in the setting of Covid 19 pneumonia.  Patient transferred from [facility] intubated, sedated.  Past Medical History  Osteoarthritis Hyperlipidemia BPH Right Herniorrhaphy  Pulmonary Embolism - 2018, large left PE, RV/LV ratio 1.3 suggestive of RH strain, fatty infiltration of liver, s/p 6 months anticoagulation LLL Pulmonary Nodule - seen on 2018 CTA chest, 55mm  COVID-19 - positive 02/16/19 at PCP  [facility] Events   12/07 Admit to [facility] from [facility]  12/08 Convalescent plasma, episodes of brady to 30's 12/09 Episode of bradycardia, polymorphic VT / rhythm disturbances  Consults:    Procedures:  ETT 12/7 >>  L Edie TLC 12/7 >>   Significant Diagnostic Tests:   EKG 12/7 >>   Micro Data:  COVID 12/7 (OSH) >> positive  BCx2 12/7 >>  Tracheal Aspirate 12/7 >> moderate GPC's in chains, few GNR >> Urinalysis 12/7 >> negative, mild proteinuria  Quantiferon Gold 12/7 >>  Acute Hepatitis Panel 12/7 >> negative   Antimicrobials:    Interim history/subjective:  PEEP 16, 100%, Pplat 33, Peak 36 Multiple PVC's/PAC's Dopamine gtt started for bradycardia  Objective   Blood pressure (!) 122/56, pulse (!) 41, temperature 97.9 F (36.6 C), resp. rate 19, height 5\' 10"  (1.778 m), weight 75.5 kg, SpO2 100 %.    Vent Mode: PRVC FiO2 (%):  [80 %-100 %] 100 % Set Rate:  [20 bmp] 20 bmp Vt Set:  [520 mL] 520 mL PEEP:  [10 cmH20-16 cmH20] 16 cmH20 Plateau Pressure:  [23 cmH20-32 cmH20] 32 cmH20   Intake/Output Summary (Last 24 hours) at 02/20/2019 0936 Last data filed at 02/20/2019 0900 Gross per 24 hour  Intake 2252.17 ml  Output 1674 ml  Net 578.17 ml   Filed Weights   02/26/2019 1530 02/19/19 0449  02/20/19 0500  Weight: 95.3 kg 74.1 kg 75.5 kg    Examination: General: elderly male lying in bed in NAD on vent, critically ill appearing    HEENT: MM pink/moist, ETT, pupils 70mm=/reactive, anicteric  Neuro: sedate  CV: s1s2 rrr, intermittent multifocal PVC/PAC's, no m/r/g PULM:  Even/non-labored, lungs bilaterally diminished but clear anterior GI: soft, bsx4 active  Extremities: warm/dry, no edema  Skin: no rashes or lesions   Resolved Hospital Problem list     Assessment & Plan:   Acute Hypoxemic Respiratory Failure in the setting of COVID-19 Pneumonia Moderate ARDS Received convalescent plasma, actemera.  PEEP 16, 100%, Pplat 33, Peak 36 -low Vt ventilation 4-8cc/kg -goal plateau pressure <30, driving pressure <94 cm H2O -target PaO2 55-65, titrate PEEP/FiO2 per ARDS protocol  -if P/F ratio <150, consider prone therapy for 16 hours per day -goal CVP <4, diuresis as necessary -VAP prevention measures  -follow intermittent CXR  -decadron, remdesivir  -resume prone ventilation   Hypotension  Bradycardia Polymorphic VT -ensure K>4, Mg>2 -repeat labs at 1400 -telemetry monitoring  -dopamine for HR >40 -follow repeat troponin, EKG -NO CPR in the event of arrest   Need for Sedation secondary to Mechanical Ventilation  -PAD protocol  -RASS Goal -4 to -5 if with ventilator synchrony  -PRN NMP  -fentanyl / versed gtt's  Hyperlipidemia -continue home fenofibrate, simvastatin   BPH -continue home finesteride   At Risk Malnutrition  -TF per nutrition  GOC Discussed with wife via phone regarding plan of care.  Reviewed his current critical illness and events of the am (rhythm disturbances).  We reviewed the concept of CPR and she indicates she remembered that he had said before that he would not want to "be brought back". We discussed that we would continue all current efforts to hopefully restore his health but that the longer he remains critically ill, the less  likely it will be that his former state of health will be restored.  She is currently waiting on her test results. LIMITED CODE established.    Best practice:  Diet: TF  Pain/Anxiety/Delirium protocol (if indicated): yes VAP protocol (if indicated): yes  DVT prophylaxis: lovenox  GI prophylaxis: pepcid  Glucose control: SSI, sensitive scale  Mobility: BR  Code Status: Intubation only, current support.  Family Communication: Wife - Lucila MaineDenzel Hirano [phone] updated via phone 12/9.   Disposition: ICU  Labs   CBC: Recent Labs  Lab 02/26/2019 1450  02/19/19 0355 02/19/19 0422 02/19/19 1751 02/20/19 0500 02/20/19 0721  WBC 7.3  --  3.4*  --   --  4.0  --   NEUTROABS 6.3  --  2.8  --   --  3.2  --   HGB 13.1   < > 12.0* 12.9* 11.6* 11.8* 12.6*  HCT 41.5   < > 38.0* 38.0* 34.0* 38.3* 37.0*  MCV 94.5  --  93.8  --   --  95.0  --   PLT 269  --  301  --   --  327  --    < > = values in this interval not displayed.    Basic Metabolic Panel: Recent Labs  Lab 02/17/2019 1450  02/19/19 0355 02/19/19 0422 02/19/19 1700 02/19/19 1751 02/20/19 0500 02/20/19 0721  NA 147*   < > 146* 143  --  144 144 144  K 4.9   < > 5.1 4.9  --  4.8 5.0 5.0  CL 108  --  108  --   --   --  107  --   CO2 28  --  28  --   --   --  30  --   GLUCOSE 129*  --  194*  --   --   --  235*  --   BUN 21  --  36*  --   --   --  47*  --   CREATININE 1.16  --  1.19  --   --   --  0.81  --   CALCIUM 8.5*  --  8.8*  --   --   --  9.1  --   MG 1.7  --  1.9  --  2.1  --  2.0  --   PHOS 3.8  --  3.2  --  3.8  --  3.6  --    < > = values in this interval not displayed.   GFR: Estimated Creatinine Clearance: 68.8 mL/min (by C-G formula based on SCr of 0.81 mg/dL). Recent Labs  Lab 02/28/2019 1450 02/19/19 0355 02/20/19 0500  PROCALCITON 0.24  --   --   WBC 7.3 3.4* 4.0    Liver Function Tests: Recent Labs  Lab 02/26/2019 1450 02/19/19 0355 02/20/19 0500  AST 39 33 32  ALT 19 17 20   ALKPHOS 40 38 43   BILITOT 0.5 0.3 0.7  PROT 6.6 5.9* 6.0*  ALBUMIN 3.0* 2.5* 2.7*   No results for input(s): LIPASE,  AMYLASE in the last 168 hours. No results for input(s): AMMONIA in the last 168 hours.  ABG    Component Value Date/Time   PHART 7.299 (L) 02/20/2019 0721   PCO2ART 55.7 (H) 02/20/2019 0721   PO2ART 48.0 (L) 02/20/2019 0721   HCO3 27.4 02/20/2019 0721   TCO2 29 02/20/2019 0721   O2SAT 79.0 02/20/2019 0721     Coagulation Profile: No results for input(s): INR, PROTIME in the last 168 hours.  Cardiac Enzymes: No results for input(s): CKTOTAL, CKMB, CKMBINDEX, TROPONINI in the last 168 hours.  HbA1C: Hgb A1c MFr Bld  Date/Time Value Ref Range Status  03/08/2019 02:50 PM 6.2 (H) 4.8 - 5.6 % Final    Comment:    (NOTE) Pre diabetes:          5.7%-6.4% Diabetes:              >6.4% Glycemic control for   <7.0% adults with diabetes     CBG: Recent Labs  Lab 02/19/19 1506 02/19/19 2020 02/20/19 0013 02/20/19 0508 02/20/19 0812  GLUCAP 133* 124* 186* 192* 161*     Critical care time: 35 minutes     Canary Brim, MSN, NP-C [facility] 02/20/2019, 9:36 AM   Please see [email or web address] for pager details.

## 2019-02-20 NOTE — Progress Notes (Signed)
Attending:    Subjective: Polymorphic VT this morning  Remains on high pressure, high FiO2  Objective: Vitals:   02/20/19 1100 02/20/19 1115 02/20/19 1130 02/20/19 1145  BP: (!) 129/59 121/62 122/62 119/61  Pulse: (!) 48 (!) 47 (!) 46 (!) 47  Resp: 19 20 20 18   Temp: (!) 97 F (36.1 C) (!) 97 F (36.1 C) (!) 97 F (36.1 C) (!) 97 F (36.1 C)  TempSrc:      SpO2: 100% 100% 100% 100%  Weight:      Height:       Vent Mode: PRVC FiO2 (%):  [80 %-100 %] 100 % Set Rate:  [20 bmp] 20 bmp Vt Set:  [520 mL] 520 mL PEEP:  [10 cmH20-16 cmH20] 16 cmH20 Plateau Pressure:  [25 cmH20-32 cmH20] 32 cmH20  Intake/Output Summary (Last 24 hours) at 02/20/2019 1208 Last data filed at 02/20/2019 1100 Gross per 24 hour  Intake 1886 ml  Output 1964 ml  Net -78 ml    General:  In bed on vent HENT: NCAT ETT in place PULM: CTA B, vent supported breathing CV: RRR, no mgr GI: BS+, soft, nontender MSK: normal bulk and tone Neuro: sedated on vent   CBC    Component Value Date/Time   WBC 4.0 02/20/2019 0500   RBC 4.03 (L) 02/20/2019 0500   HGB 12.6 (L) 02/20/2019 0721   HCT 37.0 (L) 02/20/2019 0721   PLT 327 02/20/2019 0500   MCV 95.0 02/20/2019 0500   MCH 29.3 02/20/2019 0500   MCHC 30.8 02/20/2019 0500   RDW 13.5 02/20/2019 0500   LYMPHSABS 0.6 (L) 02/20/2019 0500   MONOABS 0.2 02/20/2019 0500   EOSABS 0.0 02/20/2019 0500   BASOSABS 0.0 02/20/2019 0500    BMET    Component Value Date/Time   NA 144 02/20/2019 0721   K 5.0 02/20/2019 0721   CL 107 02/20/2019 0500   CO2 30 02/20/2019 0500   GLUCOSE 235 (H) 02/20/2019 0500   BUN 47 (H) 02/20/2019 0500   CREATININE 0.81 02/20/2019 0500   CALCIUM 9.1 02/20/2019 0500   GFRNONAA >60 02/20/2019 0500   GFRAA >60 02/20/2019 0500    CXR images bilateral airspace disease, ETT in place  Impression/Plan:  ARDS due to COVID 19 pneumonia: severe, no improvement this week, complicated by cardiac instability.  Given his advanced age  his prognosis is poor.  Continue decadron/remdesivir per protocol, ARDS vent settings Polymorphic VT this AM: hold prone positioning, discuss code status with his wife, if she agrees to DNR then OK to go back to prone because ultimately that would be better for his lungs but would prefer to only do that if we are not going to perform CPR in the event of an arrest  Rest per NP note  My cc time 33 minutes  Roselie Awkward, MD [place] [facility] Pager: [phone] Cell: [phone] After 3pm or if no response, call [phone]

## 2019-02-20 NOTE — Progress Notes (Signed)
Pt proned at 1700.  ETT secured with cloth tape.  Mepilex applied to protect skin.  No complications.

## 2019-02-20 NOTE — Progress Notes (Signed)
  Echocardiogram 2D Echocardiogram has been performed.  Andrew Friedman 02/20/2019, 12:32 PM

## 2019-02-20 NOTE — Progress Notes (Signed)
0645 Called [facility]Link for Atropine Once Order.  Last Atropine given at 0300 for Bradycardia.  Next dose availabe per order is 0900 today.  Talked with Liz, RN, regarding issue.  Liz stated will discuss case with Dr Ogdan.  Awaiting orders or further instructions from [facility] provider.   0655 - Awaiting any new orders from [facility] Provider, continue to monitor low HR.  Changed Lead viewing from Lead I to II then III.  Baseline showed multiple waves with QRS still at 36 to 40 bpm.  Pt continues to have pulse and cycling BP.  Neo gtt restarted at 20mcg/min d/t MAP boderline.  Returned Cardiac Monitor back to Lead I but continued to see unsteady baseline.  D/T poor EKG waveform, decided to unprone pat. Awaiting for RT and other nurse to assist with this.  0703 Pt unproned with 2 RT and 3 nurses.  Placed pads on pt as precautionary.  Pt eyes open and looking at us even though on Versed at 3.5 mg/hr and Fentanyl at 200 mcg/hr.  After pad placed and EKG patched attached on front, pt calm and eyes closed.  Pulse steady and strong even with low HR, BPs 120-130/80.  Neo turned off.  Provider/Hospitalist at bedside.  Updated given by nurse to provider then to Day shift Nurse, Ryan.  New orders received.    0710 12Lead EKG obtained and Dopamine gtt started at 1.5mcg/kg/min as per ordered and Mg2+  2gms administered.  Blood work being drawn as ordered. 

## 2019-02-20 NOTE — Progress Notes (Addendum)
Called and updated pt's spouse, Denzal.  Pt on dopamine gtt to assist in keeping HR up some along with BP as needed.  PT is proned tonight to assist with oxygenation.  Pt's spouse updated contact info:  Cell:  [phone].  Pt's spouse was unavailable today but would like provider to call her tomorrow get an update and status on pt's condition as well plan of care.

## 2019-02-20 NOTE — Progress Notes (Addendum)
Head repositioned. Arms rotated. ETT secured.   

## 2019-02-20 NOTE — Progress Notes (Signed)
[facility] Progress Note Patient Name: Andrew Friedman DOB: Oct 01, 1930 MRN: [id number]   Date of Service  02/20/2019  HPI/Events of Note  Bradycardia with heart rate 35, BP within normal limits  eICU Interventions  Dopamine infusion at 1.5 mg/kg/min, discontinue PRN Atropine        [name] 02/20/2019, 7:01 AM

## 2019-02-20 NOTE — Progress Notes (Signed)
Head repositioned. Arms rotated. ETT secured.   

## 2019-02-21 ENCOUNTER — Inpatient Hospital Stay (HOSPITAL_COMMUNITY): Payer: Medicare Other

## 2019-02-21 LAB — FERRITIN: Ferritin: 674 ng/mL — ABNORMAL HIGH (ref 24–336)

## 2019-02-21 LAB — GLUCOSE, CAPILLARY
Glucose-Capillary: 115 mg/dL — ABNORMAL HIGH (ref 70–99)
Glucose-Capillary: 145 mg/dL — ABNORMAL HIGH (ref 70–99)
Glucose-Capillary: 147 mg/dL — ABNORMAL HIGH (ref 70–99)
Glucose-Capillary: 151 mg/dL — ABNORMAL HIGH (ref 70–99)
Glucose-Capillary: 155 mg/dL — ABNORMAL HIGH (ref 70–99)
Glucose-Capillary: 157 mg/dL — ABNORMAL HIGH (ref 70–99)
Glucose-Capillary: 74 mg/dL (ref 70–99)

## 2019-02-21 LAB — COMPREHENSIVE METABOLIC PANEL
ALT: 22 U/L (ref 0–44)
AST: 34 U/L (ref 15–41)
Albumin: 2.8 g/dL — ABNORMAL LOW (ref 3.5–5.0)
Alkaline Phosphatase: 48 U/L (ref 38–126)
Anion gap: 10 (ref 5–15)
BUN: 45 mg/dL — ABNORMAL HIGH (ref 8–23)
CO2: 33 mmol/L — ABNORMAL HIGH (ref 22–32)
Calcium: 9.1 mg/dL (ref 8.9–10.3)
Chloride: 103 mmol/L (ref 98–111)
Creatinine, Ser: 0.81 mg/dL (ref 0.61–1.24)
GFR calc Af Amer: >60 mL/min (ref >60)
GFR calc non Af Amer: >60 mL/min (ref >60)
Glucose, Bld: 172 mg/dL — ABNORMAL HIGH (ref 70–99)
Potassium: 5 mmol/L (ref 3.5–5.1)
Sodium: 146 mmol/L — ABNORMAL HIGH (ref 135–145)
Total Bilirubin: 0.4 mg/dL (ref 0.3–1.2)
Total Protein: 6.5 g/dL (ref 6.5–8.1)

## 2019-02-21 LAB — CBC WITH DIFFERENTIAL/PLATELET
Abs Immature Granulocytes: 0.06 10*3/uL (ref 0.00–0.07)
Basophils Absolute: 0 10*3/uL (ref 0.0–0.1)
Basophils Relative: 1 %
Eosinophils Absolute: 0 10*3/uL (ref 0.0–0.5)
Eosinophils Relative: 0 %
HCT: 43.4 % (ref 39.0–52.0)
Hemoglobin: 13.6 g/dL (ref 13.0–17.0)
Immature Granulocytes: 1 %
Lymphocytes Relative: 5 %
Lymphs Abs: 0.4 10*3/uL — ABNORMAL LOW (ref 0.7–4.0)
MCH: 29.5 pg (ref 26.0–34.0)
MCHC: 31.3 g/dL (ref 30.0–36.0)
MCV: 94.1 fL (ref 80.0–100.0)
Monocytes Absolute: 0.2 10*3/uL (ref 0.1–1.0)
Monocytes Relative: 2 %
Neutro Abs: 7.6 10*3/uL (ref 1.7–7.7)
Neutrophils Relative %: 91 %
Platelets: 434 10*3/uL — ABNORMAL HIGH (ref 150–400)
RBC: 4.61 MIL/uL (ref 4.22–5.81)
RDW: 13.1 % (ref 11.5–15.5)
WBC: 8.3 10*3/uL (ref 4.0–10.5)
nRBC: 0 % (ref 0.0–0.2)

## 2019-02-21 LAB — CULTURE, RESPIRATORY W GRAM STAIN: Culture: NORMAL

## 2019-02-21 LAB — POCT I-STAT 7, (LYTES, BLD GAS, ICA,H+H)
Acid-Base Excess: 7 mmol/L — ABNORMAL HIGH (ref 0.0–2.0)
Bicarbonate: 34.6 mmol/L — ABNORMAL HIGH (ref 20.0–28.0)
Calcium, Ion: 1.28 mmol/L (ref 1.15–1.40)
HCT: 39 % (ref 39.0–52.0)
Hemoglobin: 13.3 g/dL (ref 13.0–17.0)
O2 Saturation: 100 %
Patient temperature: 98.2
Potassium: 4.4 mmol/L (ref 3.5–5.1)
Sodium: 144 mmol/L (ref 135–145)
TCO2: 36 mmol/L — ABNORMAL HIGH (ref 22–32)
pCO2 arterial: 62.2 mmHg — ABNORMAL HIGH (ref 32.0–48.0)
pH, Arterial: 7.352 (ref 7.350–7.450)
pO2, Arterial: 282 mmHg — ABNORMAL HIGH (ref 83.0–108.0)

## 2019-02-21 LAB — D-DIMER, QUANTITATIVE: D-Dimer, Quant: 2.27 ug/mL-FEU — ABNORMAL HIGH (ref 0.00–0.50)

## 2019-02-21 MED ORDER — PHENYLEPHRINE HCL-NACL 10-0.9 MG/250ML-% IV SOLN
INTRAVENOUS | Status: AC
  Filled 2019-02-21: qty 250

## 2019-02-21 MED ORDER — PHENYLEPHRINE HCL-NACL 10-0.9 MG/250ML-% IV SOLN: 0.0000 ug/min | INTRAVENOUS | Status: DC

## 2019-02-21 MED ORDER — FREE WATER
200.0000 mL | Status: DC
  Administered 2019-02-21 – 2019-02-22 (×7): 200 mL

## 2019-02-21 MED ORDER — NOREPINEPHRINE 4 MG/250ML-% IV SOLN
0.0000 ug/min | INTRAVENOUS | Status: DC
  Administered 2019-02-21: 2 ug/min via INTRAVENOUS
  Administered 2019-02-22: 12 ug/min via INTRAVENOUS
  Administered 2019-02-23: 1 ug/min via INTRAVENOUS
  Administered 2019-02-23: 4 ug/min via INTRAVENOUS
  Administered 2019-02-24: 1 ug/min via INTRAVENOUS
  Administered 2019-02-25: 2 ug/min via INTRAVENOUS
  Administered 2019-02-27: 11:00:00 1 ug/min via INTRAVENOUS
  Administered 2019-02-27: 04:00:00 2 ug/min via INTRAVENOUS
  Administered 2019-03-01: 5 ug/min via INTRAVENOUS
  Administered 2019-03-01: 7 ug/min via INTRAVENOUS
  Filled 2019-02-21 (×8): qty 250

## 2019-02-21 MED FILL — Medication: Qty: 1 | Status: AC

## 2019-02-21 NOTE — Progress Notes (Signed)
NAME:  Collier FlowersLarry G Vandenheuvel, MRN:  [id number], DOB:  [birth year], LOS: 3 ADMISSION DATE:  02/25/2019, CONSULTATION DATE:  02/22/2019 [facility]  [facility], CHIEF COMPLAINT:  SOB   Brief History   85 year old male admitted 12/7 with respiratory distress in the setting of Covid 19 pneumonia.  [facility].  Past Medical History  Osteoarthritis Hyperlipidemia BPH Right Herniorrhaphy  Pulmonary Embolism - 2018, large left PE, RV/LV ratio 1.3 suggestive of RH strain, fatty infiltration of liver, s/p 6 months anticoagulation LLL Pulmonary Nodule - seen on 2018 CTA chest, 8mm  COVID-19 - positive 02/16/19 at PCP  Significant Hospital Events   [facility]  12/08 Convalescent plasma, episodes of brady to 30's 12/09 Episode of bradycardia, polymorphic VT / rhythm disturbances  Consults:    Procedures:  ETT 12/7 >>  L Hurley TLC 12/7 >>   Significant Diagnostic Tests:   EKG 12/8 >> SB  Micro Data:  COVID 12/7 (OSH) >> positive  BCx2 12/7 >>  Tracheal Aspirate 12/7 >> normal flora, fungus isolated, suspected contaminant Urinalysis 12/7 >> negative, mild proteinuria  Quantiferon Gold 12/7 >>  Acute Hepatitis Panel 12/7 >> negative   Antimicrobials:    Interim history/subjective:  Prone position.  Remains on dopamine gtt.   PEEP 16, FiO2 80%, PAP 36, Pplat 31, Driving pressure 15 Tmax 40.999.3 I/O- UOP 2.1L in 24h, net neg 777 in 24h.   Objective   Blood pressure (!) 87/60, pulse 84, temperature 99.3 F (37.4 C), resp. rate 20, height 5\' 10"  (1.778 m), weight 75 kg, SpO2 100 %.    Vent Mode: PRVC FiO2 (%):  [80 %-100 %] 80 % Set Rate:  [20 bmp] 20 bmp Vt Set:  [520 mL] 520 mL PEEP:  [16 cmH20] 16 cmH20 Plateau Pressure:  [28 cmH20-32 cmH20] 31 cmH20   Intake/Output Summary (Last 24 hours) at 02/21/2019 1049 Last data filed at 02/21/2019 0910 Gross per 24 hour  Intake 1047.81 ml  Output 1750  ml  Net -702.19 ml   Filed Weights   02/19/19 0449 02/20/19 0500 02/21/19 0500  Weight: 74.1 kg 75.5 kg 75 kg    Examination: General: elderly gentleman lying in bed, critically ill appearing in NAD, prone HEENT: MM pink/moist, ETT Neuro: sedate  CV: s1s2 RRR, no m/r/g PULM:  Synchronous on vent, lungs bilaterally diminished, faint bibasilar crackles  GI: soft, bsx4 active  Extremities: warm/dry, no edema  Skin: no rashes or lesions  CXR 12/10 >> images personally reviewed, improvement in bilateral opacities, ETT 7.8 cm above carina   Resolved Hospital Problem list     Assessment & Plan:   Acute Hypoxemic Respiratory Failure in the setting of COVID-19 Pneumonia Moderate ARDS Received convalescent plasma, actemera, remdesivir PEEP 16, FiO2 80%, PAP 36, Pplat 31, Driving pressure 15 -low Vt ventilation 4-8cc/kg -goal plateau pressure <30, driving pressure <81<15 cm [id number] -target PaO2 55-65, titrate PEEP/FiO2 per ARDS protocol  -if P/F ratio <150, assess gas post prone, repeat if necessary -goal CVP <4, diuresis as necessary -VAP prevention measures  -follow intermittent CXR  -decadron D4/10 -advance ETT 3cm  Hypotension  Bradycardia Polymorphic VT -tele monitoring -dopamine for HR> 40 -ensure K>4, Mg+>2 -NO CPR in the event of arrest  Hypernatremia -Free water 200 ml Q4  Need for Sedation secondary to Mechanical Ventilation  -PAD protocol  -RASS Goal: -4 to -5 with PRN NMB  -continue fentanyl / versed  Hyperlipidemia -continue simvastatin, fenofibrate  at home dose  BPH -continue home finesteride  At Risk Malnutrition  -TF per Nutrition   GOC Discussed with wife via phone regarding plan of care 12/9. LIMITED CODE. No CPR.     Best practice:  Diet: TF  Pain/Anxiety/Delirium protocol (if indicated): yes VAP protocol (if indicated): yes  DVT prophylaxis: lovenox  GI prophylaxis: pepcid  Glucose control: SSI, sensitive scale  Mobility: BR  Code Status:  Intubation only, current support.  Family Communication: Wife - Arvo Ealy [phone] updated via phone 12/10   Disposition: ICU  Labs   CBC: Recent Labs  Lab 02/26/2019 1450 02/19/19 0355 02/19/19 0422 02/19/19 1751 02/20/19 0500 02/20/19 0721 02/21/19 0520  WBC 7.3 3.4*  --   --  4.0  --  8.3  NEUTROABS 6.3 2.8  --   --  3.2  --  7.6  HGB 13.1 12.0* 12.9* 11.6* 11.8* 12.6* 13.6  HCT 41.5 38.0* 38.0* 34.0* 38.3* 37.0* 43.4  MCV 94.5 93.8  --   --  95.0  --  94.1  PLT 269 301  --   --  327  --  434*    Basic Metabolic Panel: Recent Labs  Lab 03/03/2019 1450 02/19/19 0355 02/19/19 1700 02/19/19 1751 02/20/19 0500 02/20/19 0721 02/20/19 1420 02/21/19 0520  NA 147* 146*  --  144 144 144 147* 146*  K 4.9 5.1  --  4.8 5.0 5.0 4.7 5.0  CL 108 108  --   --  107  --  108 103  CO2 28 28  --   --  30  --  31 33*  GLUCOSE 129* 194*  --   --  235*  --  144* 172*  BUN 21 36*  --   --  47*  --  44* 45*  CREATININE 1.16 1.19  --   --  0.81  --  0.67 0.81  CALCIUM 8.5* 8.8*  --   --  9.1  --  8.9 9.1  MG 1.7 1.9 2.1  --  2.0  --  2.2  --   PHOS 3.8 3.2 3.8  --  3.6  --  4.0  --    GFR: Estimated Creatinine Clearance: 68.8 mL/min (by C-G formula based on SCr of 0.81 mg/dL). Recent Labs  Lab 02/21/2019 1450 02/19/19 0355 02/20/19 0500 02/21/19 0520  PROCALCITON 0.24  --   --   --   WBC 7.3 3.4* 4.0 8.3    Liver Function Tests: Recent Labs  Lab 02/28/2019 1450 02/19/19 0355 02/20/19 0500 02/21/19 0520  AST 39 33 32 34  ALT 19 17 20 22   ALKPHOS 40 38 43 48  BILITOT 0.5 0.3 0.7 0.4  PROT 6.6 5.9* 6.0* 6.5  ALBUMIN 3.0* 2.5* 2.7* 2.8*   No results for input(s): LIPASE, AMYLASE in the last 168 hours. No results for input(s): AMMONIA in the last 168 hours.  ABG    Component Value Date/Time   PHART 7.299 (L) 02/20/2019 0721   PCO2ART 55.7 (H) 02/20/2019 0721   PO2ART 48.0 (L) 02/20/2019 0721   HCO3 27.4 02/20/2019 0721   TCO2 29 02/20/2019 0721   O2SAT 79.0  02/20/2019 0721     Coagulation Profile: No results for input(s): INR, PROTIME in the last 168 hours.  Cardiac Enzymes: No results for input(s): CKTOTAL, CKMB, CKMBINDEX, TROPONINI in the last 168 hours.  HbA1C: Hgb A1c MFr Bld  Date/Time Value Ref Range Status  02/28/2019 02:50 PM 6.2 (H) 4.8 - 5.6 %  Final    Comment:    (NOTE) Pre diabetes:          5.7%-6.4% Diabetes:              >6.4% Glycemic control for   <7.0% adults with diabetes     CBG: Recent Labs  Lab 02/20/19 1549 02/20/19 2114 02/20/19 2349 02/21/19 0522 02/21/19 0755  GLUCAP 115* 103* 151* 157* 147*     Critical care time: 30 minutes     Canary Brim, MSN, NP-C [facility] 02/21/2019, 10:49 AM   Please see [email or web address] for pager details.

## 2019-02-21 NOTE — Progress Notes (Signed)
Pt returned to supine position. ETT remains secured at 25cm at the lip. Cloth tape and protective barriers taken off and holister tube holder placed on pt. RT will continue to monitor,.

## 2019-02-21 NOTE — Progress Notes (Addendum)
0740: Assumed care of Pt from night RN. Pt intubated, sedated, proned. VSS on dopamine 2.10mcg/kg, fentanyl 218mcg, versed 5mg . CL and foley in place. TF off for now due to high residuals (228mL for me this morning). Repositioned, will monitor.   1015: Pt turned supine with no immediate complications. Will monitor  1100: Per MD, will pull foley and restart TF.   1600: Spouse updated on Pt status. All questions answered. Per wife, if she has to be admitted into the hospital, she wants the next point of contact for Harper to be Wonda Cheng [phone]). Fredric Dine (niece) was removed from point of contact list per wife request.   4076: Reassessed, abdomen distended since restarting TF, NP made aware. Pt also requiring slightly more dopamine at 5 mcg/kg, NP aware. Still has not voided since foley removal, will follow protocol and bladder scan.   1815: Pt did not void. Bladder scan = 355 mL. Per protocol straight cath'ed for 400 mL. Will monitor

## 2019-02-21 NOTE — Progress Notes (Signed)
[facility] Progress Note Patient Name: SYLVESTRE RATHGEBER DOB: [birth year] MRN: [id number]   Date of Service  02/21/2019  HPI/Events of Note  Pt with hypotension despite Dopamine infusion, past hx of severe bradycardia while on a Phenylephrine infusion.  eICU Interventions  Norepinephrine ordered, Dopamine max dose increased to 10 mcg/kg/min        Therasa Lorenzi U Daylani Deblois 02/21/2019, 8:32 PM

## 2019-02-21 NOTE — Progress Notes (Signed)
Attending:    Subjective: Yesterday code status changed to limited code Back to prone Remains on low dose dopamine Plateau 28 FiO2 80%  Objective: Vitals:   02/21/19 0530 02/21/19 0600 02/21/19 0630 02/21/19 0700  BP: 115/73 122/74 126/72 (!) 87/60  Pulse: 72 87  84  Resp: 20 20 20 20   Temp: 99.1 F (37.3 C) 99.3 F (37.4 C) 99.3 F (37.4 C) 99.3 F (37.4 C)  TempSrc:      SpO2: 100% 100%  100%  Weight:      Height:       Vent Mode: PRVC FiO2 (%):  [80 %-100 %] 80 % Set Rate:  [20 bmp] 20 bmp Vt Set:  [520 mL] 520 mL PEEP:  [16 cmH20] 16 cmH20 Plateau Pressure:  [28 cmH20-32 cmH20] 31 cmH20  Intake/Output Summary (Last 24 hours) at 02/21/2019 0943 Last data filed at 02/21/2019 0910 Gross per 24 hour  Intake 1271.49 ml  Output 1950 ml  Net -678.51 ml     .General:  In bed on vent HENT: NCAT ETT in place PULM: CTA B, vent supported breathing CV: difficult to examine, prone GI:  difficult to examine, prone MSK: normal bulk and tone Neuro: sedated on vent     CBC    Component Value Date/Time   WBC 8.3 02/21/2019 0520   RBC 4.61 02/21/2019 0520   HGB 13.6 02/21/2019 0520   HCT 43.4 02/21/2019 0520   PLT 434 (H) 02/21/2019 0520   MCV 94.1 02/21/2019 0520   MCH 29.5 02/21/2019 0520   MCHC 31.3 02/21/2019 0520   RDW 13.1 02/21/2019 0520   LYMPHSABS 0.4 (L) 02/21/2019 0520   MONOABS 0.2 02/21/2019 0520   EOSABS 0.0 02/21/2019 0520   BASOSABS 0.0 02/21/2019 0520    BMET    Component Value Date/Time   NA 146 (H) 02/21/2019 0520   K 5.0 02/21/2019 0520   CL 103 02/21/2019 0520   CO2 33 (H) 02/21/2019 0520   GLUCOSE 172 (H) 02/21/2019 0520   BUN 45 (H) 02/21/2019 0520   CREATININE 0.81 02/21/2019 0520   CALCIUM 9.1 02/21/2019 0520   GFRNONAA >60 02/21/2019 0520   GFRAA >60 02/21/2019 0520    CXR images reviewed, L > R infiltrate  Impression/Plan: ARDS >minimal improvement, continue full vent support Sedation: continue PAD protocol COntinue  tube feedings Hypernatremia> start free water  Rest per NP note  My cc time 33 minutes  Roselie Awkward, MD [place] [facility] Pager: [phone] Cell: [phone] After 3pm or if no response, call [phone]

## 2019-02-22 ENCOUNTER — Inpatient Hospital Stay (HOSPITAL_COMMUNITY): Payer: Medicare Other

## 2019-02-22 DIAGNOSIS — A419 Sepsis, unspecified organism: Secondary | ICD-10-CM

## 2019-02-22 DIAGNOSIS — R6521 Severe sepsis with septic shock: Secondary | ICD-10-CM

## 2019-02-22 LAB — POCT I-STAT 7, (LYTES, BLD GAS, ICA,H+H)
Acid-Base Excess: 1 mmol/L (ref 0.0–2.0)
Acid-Base Excess: 1 mmol/L (ref 0.0–2.0)
Bicarbonate: 27 mmol/L (ref 20.0–28.0)
Bicarbonate: 30 mmol/L — ABNORMAL HIGH (ref 20.0–28.0)
Calcium, Ion: 1.25 mmol/L (ref 1.15–1.40)
Calcium, Ion: 1.27 mmol/L (ref 1.15–1.40)
HCT: 41 % (ref 39.0–52.0)
HCT: 41 % (ref 39.0–52.0)
Hemoglobin: 13.9 g/dL (ref 13.0–17.0)
Hemoglobin: 13.9 g/dL (ref 13.0–17.0)
O2 Saturation: 82 %
O2 Saturation: 98 %
Patient temperature: 98.6
Potassium: 4.8 mmol/L (ref 3.5–5.1)
Potassium: 4.4 mmol/L (ref 3.5–5.1)
Sodium: 141 mmol/L (ref 135–145)
Sodium: 144 mmol/L (ref 135–145)
TCO2: 28 mmol/L (ref 22–32)
TCO2: 32 mmol/L (ref 22–32)
pCO2 arterial: 49.5 mmHg — ABNORMAL HIGH (ref 32.0–48.0)
pCO2 arterial: 67.3 mmHg (ref 32.0–48.0)
pH, Arterial: 7.344 — ABNORMAL LOW (ref 7.350–7.450)
pH, Arterial: 7.257 — ABNORMAL LOW (ref 7.350–7.450)
pO2, Arterial: 50 mmHg — ABNORMAL LOW (ref 83.0–108.0)
pO2, Arterial: 132 mmHg — ABNORMAL HIGH (ref 83.0–108.0)

## 2019-02-22 LAB — CBC WITH DIFFERENTIAL/PLATELET
Abs Immature Granulocytes: 0.06 10*3/uL (ref 0.00–0.07)
Basophils Absolute: 0.1 10*3/uL (ref 0.0–0.1)
Basophils Relative: 1 %
Eosinophils Absolute: 0 10*3/uL (ref 0.0–0.5)
Eosinophils Relative: 0 %
HCT: 43.9 % (ref 39.0–52.0)
Hemoglobin: 13.8 g/dL (ref 13.0–17.0)
Immature Granulocytes: 1 %
Lymphocytes Relative: 2 %
Lymphs Abs: 0.2 10*3/uL — ABNORMAL LOW (ref 0.7–4.0)
MCH: 29.9 pg (ref 26.0–34.0)
MCHC: 31.4 g/dL (ref 30.0–36.0)
MCV: 95.2 fL (ref 80.0–100.0)
Monocytes Absolute: 0.1 10*3/uL (ref 0.1–1.0)
Monocytes Relative: 1 %
Neutro Abs: 11.4 10*3/uL — ABNORMAL HIGH (ref 1.7–7.7)
Neutrophils Relative %: 95 %
Platelets: 368 10*3/uL (ref 150–400)
RBC: 4.61 MIL/uL (ref 4.22–5.81)
RDW: 13.1 % (ref 11.5–15.5)
WBC: 11.9 10*3/uL — ABNORMAL HIGH (ref 4.0–10.5)
nRBC: 0 % (ref 0.0–0.2)

## 2019-02-22 LAB — GLUCOSE, CAPILLARY
Glucose-Capillary: 144 mg/dL — ABNORMAL HIGH (ref 70–99)
Glucose-Capillary: 150 mg/dL — ABNORMAL HIGH (ref 70–99)
Glucose-Capillary: 157 mg/dL — ABNORMAL HIGH (ref 70–99)
Glucose-Capillary: 159 mg/dL — ABNORMAL HIGH (ref 70–99)
Glucose-Capillary: 173 mg/dL — ABNORMAL HIGH (ref 70–99)
Glucose-Capillary: 201 mg/dL — ABNORMAL HIGH (ref 70–99)

## 2019-02-22 LAB — COMPREHENSIVE METABOLIC PANEL
ALT: 22 U/L (ref 0–44)
AST: 40 U/L (ref 15–41)
Albumin: 2.7 g/dL — ABNORMAL LOW (ref 3.5–5.0)
Alkaline Phosphatase: 48 U/L (ref 38–126)
Anion gap: 12 (ref 5–15)
BUN: 61 mg/dL — ABNORMAL HIGH (ref 8–23)
CO2: 31 mmol/L (ref 22–32)
Calcium: 9 mg/dL (ref 8.9–10.3)
Chloride: 101 mmol/L (ref 98–111)
Creatinine, Ser: 1.2 mg/dL (ref 0.61–1.24)
GFR calc Af Amer: >60 mL/min (ref >60)
GFR calc non Af Amer: 55 mL/min — ABNORMAL LOW (ref >60)
Glucose, Bld: 238 mg/dL — ABNORMAL HIGH (ref 70–99)
Potassium: 4.8 mmol/L (ref 3.5–5.1)
Sodium: 144 mmol/L (ref 135–145)
Total Bilirubin: 0.5 mg/dL (ref 0.3–1.2)
Total Protein: 5.8 g/dL — ABNORMAL LOW (ref 6.5–8.1)

## 2019-02-22 LAB — TROPONIN I (HIGH SENSITIVITY): Troponin I (High Sensitivity): 28 ng/L — ABNORMAL HIGH (ref <18)

## 2019-02-22 LAB — URINALYSIS, ROUTINE W REFLEX MICROSCOPIC
Bilirubin Urine: NEGATIVE
Glucose, UA: NEGATIVE mg/dL
Hgb urine dipstick: NEGATIVE
Ketones, ur: 5 mg/dL — AB
Leukocytes,Ua: NEGATIVE
Nitrite: NEGATIVE
Protein, ur: 30 mg/dL — AB
Specific Gravity, Urine: 1.018 (ref 1.005–1.030)
pH: 5 (ref 5.0–8.0)

## 2019-02-22 LAB — FERRITIN: Ferritin: 712 ng/mL — ABNORMAL HIGH (ref 24–336)

## 2019-02-22 LAB — MAGNESIUM: Magnesium: 1.9 mg/dL (ref 1.7–2.4)

## 2019-02-22 LAB — D-DIMER, QUANTITATIVE: D-Dimer, Quant: 6.35 ug/mL-FEU — ABNORMAL HIGH (ref 0.00–0.50)

## 2019-02-22 MED ORDER — VANCOMYCIN HCL 10 G IV SOLR
1250.0000 mg | INTRAVENOUS | Status: DC
  Administered 2019-02-23: 1250 mg via INTRAVENOUS
  Filled 2019-02-22: qty 1000
  Filled 2019-02-22: qty 1250

## 2019-02-22 MED ORDER — VORICONAZOLE 200 MG IV SOLR
400.0000 mg | Freq: Two times a day (BID) | INTRAVENOUS | Status: AC
  Administered 2019-02-22 – 2019-02-23 (×2): 400 mg via INTRAVENOUS
  Filled 2019-02-22 (×2): qty 400

## 2019-02-22 MED ORDER — STERILE WATER FOR INJECTION IJ SOLN
INTRAMUSCULAR | Status: AC
  Administered 2019-02-22: 10 mL
  Filled 2019-02-22: qty 10

## 2019-02-22 MED ORDER — ROCURONIUM BROMIDE 50 MG/5ML IV SOLN
75.0000 mg | INTRAVENOUS | Status: DC | PRN
  Filled 2019-02-22: qty 7.5

## 2019-02-22 MED ORDER — SODIUM CHLORIDE 0.9 % IV SOLN
2.0000 g | Freq: Two times a day (BID) | INTRAVENOUS | Status: DC
  Administered 2019-02-22 – 2019-02-24 (×4): 2 g via INTRAVENOUS
  Filled 2019-02-22 (×5): qty 2

## 2019-02-22 MED ORDER — FREE WATER
200.0000 mL | Freq: Two times a day (BID) | Status: DC
  Administered 2019-02-22 – 2019-02-25 (×6): 200 mL

## 2019-02-22 MED ORDER — VANCOMYCIN HCL 10 G IV SOLR
1500.0000 mg | Freq: Once | INTRAVENOUS | Status: AC
  Administered 2019-02-22: 1500 mg via INTRAVENOUS
  Filled 2019-02-22: qty 500

## 2019-02-22 MED ORDER — STERILE WATER FOR INJECTION IJ SOLN
INTRAMUSCULAR | Status: AC
  Administered 2019-02-22: 07:00:00
  Filled 2019-02-22: qty 10

## 2019-02-22 MED ORDER — VORICONAZOLE 200 MG PO TABS
200.0000 mg | ORAL_TABLET | Freq: Two times a day (BID) | ORAL | Status: DC
  Administered 2019-02-23 – 2019-02-25 (×5): 200 mg
  Filled 2019-02-22 (×5): qty 1

## 2019-02-22 MED ORDER — PRO-STAT SUGAR FREE PO LIQD
30.0000 mL | Freq: Every day | ORAL | Status: DC
  Administered 2019-02-22 – 2019-03-01 (×32): 30 mL
  Filled 2019-02-22 (×30): qty 30

## 2019-02-22 MED ORDER — LACTATED RINGERS IV BOLUS
1000.0000 mL | Freq: Once | INTRAVENOUS | Status: AC
  Administered 2019-02-22: 1000 mL via INTRAVENOUS

## 2019-02-22 MED ORDER — SODIUM CHLORIDE 0.9 % IV SOLN
1.0000 g | Freq: Once | INTRAVENOUS | Status: AC
  Administered 2019-02-22: 1 g via INTRAVENOUS
  Filled 2019-02-22: qty 1

## 2019-02-22 MED ORDER — VASOPRESSIN 20 UNIT/ML IV SOLN
0.0300 [IU]/min | INTRAVENOUS | Status: DC
  Administered 2019-02-22 – 2019-02-24 (×3): 0.03 [IU]/min via INTRAVENOUS
  Filled 2019-02-22 (×6): qty 2

## 2019-02-22 NOTE — Progress Notes (Signed)
Nutrition Follow-up  DOCUMENTATION CODES:   Not applicable  INTERVENTION:   Initiate Vital 1.5 @ 40 ml/hr via OG tube 30 ml Prostat five times per day  Provides: 1940 kcal, 139 grams protein, and 733 ml free water. Total free water: 1133 ml    NUTRITION DIAGNOSIS:   Increased nutrient needs related to acute illness(COVID-19 related Pneumonia) as evidenced by estimated needs.  Ongoing.   GOAL:   Patient will meet greater than or equal to 90% of their needs  Progressing.   MONITOR:   Vent status, Labs, Weight trends, TF tolerance, Skin, I & O's  REASON FOR ASSESSMENT:   Consult Enteral/tube feeding initiation and management  ASSESSMENT:   Pt with PMH of PE, OA, BPH, HLD admitted from [facility] with severe ARDS from COVID-19 PNA (tested positive 12/5).   Pt developed hypotension over night now on pressors.  Prone  Patient is currently intubated on ventilator support MV: 11.3 L/min Temp (24hrs), Avg:99.6 F (37.6 C), Min:98.3 F (36.8 C), Max:100.6 F (38.1 C)  Medications reviewed and include: decadron, colace, folic acid, MVI, thiamine, vitamin C, zinc 200 ml free water BID Levophed @ 6 mcg Vasopressin .03 units Labs reviewed    NUTRITION - FOCUSED PHYSICAL EXAM:  Deferred   Diet Order:   Diet Order            Diet NPO time specified  Diet effective now              EDUCATION NEEDS:   Not appropriate for education at this time  Skin:  Skin Assessment: Reviewed RN Assessment  Last BM:  unknown  Height:   Ht Readings from Last 1 Encounters:  02/19/19 5\' 10"  (1.778 m)    Weight:   Wt Readings from Last 1 Encounters:  02/21/19 75 kg    Ideal Body Weight:  75.4 kg  BMI:  Body mass index is 23.72 kg/m.  Estimated Nutritional Needs:   Kcal:  8338-2505  Protein:  110-120 grams  Fluid:  1.6 L  Maylon Peppers RD, LDN, CNSC [phone] Pager [phone] After Hours Pager

## 2019-02-22 NOTE — Progress Notes (Addendum)
NAME:  Andrew Friedman, MRN:  [id number], DOB:  1930-05-19, LOS: 4 ADMISSION DATE:  02/26/2019, CONSULTATION DATE:  02/27/2019 REFERRING MD:  [facility], CHIEF COMPLAINT:  SOB   Brief History   85 year old male admitted 12/7 with respiratory distress in the setting of Covid 19 pneumonia.  Patient transferred from [facility] intubated, sedated.  Past Medical History  Osteoarthritis Hyperlipidemia BPH Right Herniorrhaphy  Pulmonary Embolism - 2018, large left PE, RV/LV ratio 1.3 suggestive of RH strain, fatty infiltration of liver, s/p 6 months anticoagulation LLL Pulmonary Nodule - seen on 2018 CTA chest, 43mm  COVID-19 - positive 02/16/19 at PCP  Significant Hospital Events   12/07 Admit to [facility] from [facility]  12/08 Convalescent plasma, episodes of brady to 30's 12/09 Episode of bradycardia, polymorphic VT / rhythm disturbances  Consults:    Procedures:  ETT 12/7 >>  L [place] TLC 12/7 >>   Significant Diagnostic Tests:   EKG 12/8 >> SB  Micro Data:  COVID 12/7 (OSH) >> positive  BCx2 12/7 >>  Tracheal Aspirate 12/7 >> normal flora, fungus isolated, suspected contaminant ? Urinalysis 12/7 >> negative, mild proteinuria  Quantiferon Gold 12/7 >> negative Acute Hepatitis Panel 12/7 >> negative  BCx2 12/11 >>  Tracheal aspirate 12/11 >>  UA 12/11 >>  Antimicrobials:  Meropenem 12/11 >>  Vanco 12/11 >>  Voriconazole 12/11 >>  Interim history/subjective:  Tmax 100.6 / WBC increased to 11.9 I/O- 1.3L UOP / +820 in 24h PEEP 18, FiO2 100%, PAP 33, Pplat 33, Driving pressure 15 Started on vaso, levophed overnight.  Dopamine increased to CXR with increased density of infiltrates  Objective   Blood pressure 117/62, pulse 92, temperature 99.8 F (37.7 C), temperature source Oral, resp. rate (!) 21, height 5\' 10"  (1.778 m), weight 75 kg, SpO2 91 %.    Vent Mode: PRVC FiO2 (%):  [50 %-100 %] 100 % Set Rate:  [20 bmp] 20 bmp Vt Set:  [520 mL] 520  mL PEEP:  [16 cmH20-18 cmH20] 18 cmH20 Plateau Pressure:  [27 cmH20-36 cmH20] 32 cmH20   Intake/Output Summary (Last 24 hours) at 02/22/2019 0759 Last data filed at 02/22/2019 14/01/2019 Gross per 24 hour  Intake 2149.2 ml  Output 1330 ml  Net 819.2 ml   Filed Weights   02/19/19 0449 02/20/19 0500 02/21/19 0500  Weight: 74.1 kg 75.5 kg 75 kg    Examination: General: frail elderly gentleman lying in bed in NAD on vent, appears critically ill  HEENT: MM pink/moist, ETT, prone  Neuro: sedate, synchronous on vent  CV: s1s2 rrr, SR-ST 100's, no m/r/g PULM:  Non-labored on vent, lungs bilaterally distant, coarse without wheeze GI: soft, bsx4 active  Extremities: warm/dry, trace generalized edema  Skin: no rashes or lesions  CXR 12/11 >> images personally reviewed, worsening of bilateral opacities    Resolved Hospital Problem list   Polymorphic VT -resolved  Assessment & Plan:   Acute Hypoxemic Respiratory Failure in the setting of COVID-19 Pneumonia Moderate ARDS Received convalescent plasma, actemera, remdesivir -low Vt ventilation 4-8cc/kg -goal plateau pressure <30, driving pressure 14/11 cm <39 -target PaO2 55-65, titrate PEEP/FiO2 per ARDS protocol  -P/F ratio <150, continue prone therapy for 16 hours per day -obtain ABG post supine positioning  -VAP prevention measures  -follow intermittent CXR  -decadron D5/10  Shock, suspected septic with pulmonary source Bradycardia -continue tele monitoring  -dopamine for HR>40 -wean levophed to off, then vasopressin -empiric abx as above, empiric mold coverage per pharmacy (  increased drug drug interactions, hold statin, may also need to change to dilaudid as may increase sedation needs with increased metabolization) -ensure K>4, Mg+>2 -no CPR in the event of arrest  Hypernatremia -continue free water, reduce frequency given high volumes / prone positioning  Need for Sedation secondary to Mechanical Ventilation  -PAD Protocol   -RASS Goal: -4 to -5 with NMB -continue fentanyl + versed  Hyperlipidemia -continue fenofibrate -hold statin while on Voriconazole  BPH -continue finesteride  At Risk Malnutrition  -TF per Nutrition   GOC Discussed with wife via phone regarding plan of care 12/9. LIMITED CODE. No CPR.     Best practice:  Diet: TF  Pain/Anxiety/Delirium protocol (if indicated): yes VAP protocol (if indicated): yes  DVT prophylaxis: lovenox  GI prophylaxis: pepcid  Glucose control: SSI, sensitive scale  Mobility: BR  Code Status: Intubation only, current support.  Family Communication: Wife - Lucila MaineDenzel Basden [phone] updated via phone 12/11   Disposition: ICU  Labs   CBC: Recent Labs  Lab 03/11/2019 1450 02/19/19 0355 02/20/19 0500 02/20/19 0721 02/21/19 0520 02/21/19 1410 02/22/19 0108 02/22/19 0449  WBC 7.3 3.4* 4.0  --  8.3  --  11.9*  --   NEUTROABS 6.3 2.8 3.2  --  7.6  --  11.4*  --   HGB 13.1 12.0* 11.8* 12.6* 13.6 13.3 13.8 13.9  HCT 41.5 38.0* 38.3* 37.0* 43.4 39.0 43.9 41.0  MCV 94.5 93.8 95.0  --  94.1  --  95.2  --   PLT 269 301 327  --  434*  --  368  --     Basic Metabolic Panel: Recent Labs  Lab 03/11/2019 1450 02/19/19 0355 02/19/19 1700 02/20/19 0500 02/20/19 1420 02/21/19 0520 02/21/19 1410 02/22/19 0108 02/22/19 0449  NA 147* 146*  --  144 147* 146* 144 144 141  K 4.9 5.1  --  5.0 4.7 5.0 4.4 4.8 4.8  CL 108 108  --  107 108 103  --  101  --   CO2 28 28  --  30 31 33*  --  31  --   GLUCOSE 129* 194*  --  235* 144* 172*  --  238*  --   BUN 21 36*  --  47* 44* 45*  --  61*  --   CREATININE 1.16 1.19  --  0.81 0.67 0.81  --  1.20  --   CALCIUM 8.5* 8.8*  --  9.1 8.9 9.1  --  9.0  --   MG 1.7 1.9 2.1 2.0 2.2  --   --  1.9  --   PHOS 3.8 3.2 3.8 3.6 4.0  --   --   --   --    GFR: Estimated Creatinine Clearance: 46.5 mL/min (by C-G formula based on SCr of 1.2 mg/dL). Recent Labs  Lab 03/11/2019 1450 02/19/19 0355 02/20/19 0500 02/21/19 0520  02/22/19 0108  PROCALCITON 0.24  --   --   --   --   WBC 7.3 3.4* 4.0 8.3 11.9*    Liver Function Tests: Recent Labs  Lab 03/11/2019 1450 02/19/19 0355 02/20/19 0500 02/21/19 0520 02/22/19 0108  AST 39 33 32 34 40  ALT 19 17 20 22 22   ALKPHOS 40 38 43 48 48  BILITOT 0.5 0.3 0.7 0.4 0.5  PROT 6.6 5.9* 6.0* 6.5 5.8*  ALBUMIN 3.0* 2.5* 2.7* 2.8* 2.7*   No results for input(s): LIPASE, AMYLASE in the last 168 hours. No results for input(s):  AMMONIA in the last 168 hours.  ABG    Component Value Date/Time   PHART 7.344 (L) 02/22/2019 0449   PCO2ART 49.5 (H) 02/22/2019 0449   PO2ART 50.0 (L) 02/22/2019 0449   HCO3 27.0 02/22/2019 0449   TCO2 28 02/22/2019 0449   O2SAT 82.0 02/22/2019 0449     Coagulation Profile: No results for input(s): INR, PROTIME in the last 168 hours.  Cardiac Enzymes: No results for input(s): CKTOTAL, CKMB, CKMBINDEX, TROPONINI in the last 168 hours.  HbA1C: Hgb A1c MFr Bld  Date/Time Value Ref Range Status  02/13/2019 02:50 PM 6.2 (H) 4.8 - 5.6 % Final    Comment:    (NOTE) Pre diabetes:          5.7%-6.4% Diabetes:              >6.4% Glycemic control for   <7.0% adults with diabetes     CBG: Recent Labs  Lab 02/21/19 1125 02/21/19 1547 02/21/19 1946 02/21/19 2221 02/22/19 0509  GLUCAP 115* 145* 74 155* 201*     Critical care time: 30 minutes     Noe Gens, MSN, NP-C [facility] 02/22/2019, 7:59 AM   Please see [email or web address] for pager details.

## 2019-02-22 NOTE — Progress Notes (Signed)
Pharmacy Antibiotic Note  Andrew Friedman is a 85 y.o. male admitted on 03/18/19. Patient is now on 3 vasopressors, worsening oxygenation, had a bradycardic arrest on 12/9. He is growing fungus on respiratory culture from 12/7 - thought to be contaminant but due to overall worsening status, will initiate broad spectrum antimicrobials including fungal coverage. SCr steadily increasing 0.6 > 0.8 > 1.2.   Vancomycin 1250 mg IV Q 24 hrs. Goal AUC 400-550. Expected AUC: 538 SCr used: 1.2   Plan: -Vancomycin 1500 mg IV x1 then 1250 mg IV q24h -Meropenem 2 g IV q12h -Voriconazole 400 mg IV q12h x2 followed by 200 mg po BID -Monitor renal fx, cultures, vancomycin levels as needed -Monitor drug-drug interactions with voriconazole   Height: 5\' 10"  (177.8 cm) Weight: 165 lb 5.5 oz (75 kg) IBW/kg (Calculated) : 73  Temp (24hrs), Avg:99.8 F (37.7 C), Min:98.2 F (36.8 C), Max:100.6 F (38.1 C)  Recent Labs  Lab 2019-03-18 1450 02/19/19 0355 02/20/19 0500 02/20/19 1420 02/21/19 0520 02/22/19 0108  WBC 7.3 3.4* 4.0  --  8.3 11.9*  CREATININE 1.16 1.19 0.81 0.67 0.81 1.20    Estimated Creatinine Clearance: 46.5 mL/min (by C-G formula based on SCr of 1.2 mg/dL).    Allergies  Allergen Reactions  . Penicillin G Other (See Comments)    Other reaction(s): Other (See Comments) Diarrhea    Antimicrobials this admission: 12/11 vancomycin > 12/11 meropenem > 12/11 voriconazole >  Dose adjustments this admission: N/A  Microbiology results: 12/8 blood cx: ngtd 12/11 blood cx: 12/7 resp cx: fungus   Andrew Friedman 02/22/2019 11:55 AM

## 2019-02-22 NOTE — Progress Notes (Signed)
eLink Physician-Brief Progress Note Patient Name: COURT GRACIA DOB: 1930-12-01 MRN: [id number]   Date of Service  02/22/2019  HPI/Events of Note  Pt experiencing difficulty with oxygenation , and hypotension  eICU Interventions  PRN order for Rocuronium entered, proning order entered, Vasopressin added to pressors, portable CXR orderd.        Kerry Kass Jaylina Ramdass 02/22/2019, 5:15 AM

## 2019-02-22 NOTE — Progress Notes (Signed)
Pt placed prone at this time.

## 2019-02-22 NOTE — Progress Notes (Signed)
Attending:    Subjective: Added mutiple vasopressors overnight Plateau 27, PEEP 18 Low grade fever WBC up CXR worse Thick, copious secretions   Objective: Vitals:   02/22/19 0800 02/22/19 0815 02/22/19 0830 02/22/19 0845  BP: 111/64 121/61 113/69 103/67  Pulse: 92 99 (!) 110 (!) 110  Resp: (!) 22 20 (!) 23 (!) 28  Temp: (!) 100.6 F (38.1 C)     TempSrc: Oral     SpO2: 91% 91% 91% 91%  Weight:      Height:       Vent Mode: PRVC FiO2 (%):  [50 %-100 %] 100 % Set Rate:  [20 bmp] 20 bmp Vt Set:  [520 mL] 520 mL PEEP:  [16 cmH20-18 cmH20] 18 cmH20 Plateau Pressure:  [27 cmH20-36 cmH20] 32 cmH20  Intake/Output Summary (Last 24 hours) at 02/22/2019 0917 Last data filed at 02/22/2019 0850 Gross per 24 hour  Intake 2078.33 ml  Output 1105 ml  Net 973.33 ml    General:  In bed on vent, prone HENT: NCAT ETT in place PULM: CTA B, vent supported breathing CV: prone GI: prone MSK: normal bulk and tone Neuro: sedated on vent    CBC    Component Value Date/Time   WBC 11.9 (H) 02/22/2019 0108   RBC 4.61 02/22/2019 0108   HGB 13.9 02/22/2019 0449   HCT 41.0 02/22/2019 0449   PLT 368 02/22/2019 0108   MCV 95.2 02/22/2019 0108   MCH 29.9 02/22/2019 0108   MCHC 31.4 02/22/2019 0108   RDW 13.1 02/22/2019 0108   LYMPHSABS 0.2 (L) 02/22/2019 0108   MONOABS 0.1 02/22/2019 0108   EOSABS 0.0 02/22/2019 0108   BASOSABS 0.1 02/22/2019 0108    BMET    Component Value Date/Time   NA 141 02/22/2019 0449   K 4.8 02/22/2019 0449   CL 101 02/22/2019 0108   CO2 31 02/22/2019 0108   GLUCOSE 238 (H) 02/22/2019 0108   BUN 61 (H) 02/22/2019 0108   CREATININE 1.20 02/22/2019 0108   CALCIUM 9.0 02/22/2019 0108   GFRNONAA 55 (L) 02/22/2019 0108   GFRAA >60 02/22/2019 0108   12/7 resp culture GNR, GPC, mold 12/7 blood culture > ngtd  CXR images personally reviewed, worsening bilateral infiltrates in bases, ETT in place  Impression/Plan: ARDS> worsening oxygenation,  continue ARDS protocol, no furosemide vap prevention, follow protocol, supine ABG today Shock> most likely septic shock, check CVP, wean off levophed for MAP > 65, vasopressin; add broad spectrum ABX, vori or eraxis, discuss with pharmacy, re-culture Bradycardia> dopamine Oliguria> place foley  Rest per NP note  My cc time 31 minutes  Roselie Awkward, MD [facility] Pager: [phone] Cell: [phone] After 3pm or if no response, call [phone]

## 2019-02-22 NOTE — Progress Notes (Signed)
RT NOTE:  Pt returned to supine position. ETT remains secured at 25cm at the lip. Cloth tape and protective barriers taken off and holister tube holder placed on pt. RT will continue to monitor,.

## 2019-02-22 NOTE — Progress Notes (Signed)
[place] Progress Note Patient Name: Andrew Friedman DOB: [birth year] MRN: [id number]   Date of Service  02/22/2019  HPI/Events of Note  In/out bladder catheterization x 3 already and Pt again has urinary retention with bladder scan positive for 500 ml urine.  eICU Interventions  Foley catheter ordered.        Kerry Kass [name] 02/22/2019, 1:38 AM

## 2019-02-22 NOTE — Procedures (Signed)
Arterial Catheter Insertion Procedure Note VINCIENT VANAMAN [id number] May 23, 1930  Procedure: Insertion of Arterial Catheter  Indications: Blood pressure monitoring  Procedure Details Consent: Unable to obtain consent because of emergent medical necessity. Pt intubated. Time Out: Verified patient identification, verified procedure, site/side was marked, verified correct patient position, special equipment/implants available, medications/allergies/relevent history reviewed, required imaging and test results available.  Performed  Maximum sterile technique was used including antiseptics, cap, gloves, gown, hand hygiene, mask and sheet. Skin prep: Chlorhexidine; local anesthetic administered 20 gauge catheter was inserted into left radial artery using the Seldinger technique. ULTRASOUND GUIDANCE USED: NO Evaluation Blood flow good; BP tracing good. Complications: No apparent complications.  A-Line placed x1 attempt. Pt tolerated well. RT will continue to monitor.    Sharla Kidney 02/22/2019

## 2019-02-22 NOTE — Progress Notes (Signed)
[facility] Progress Note Patient Name: Andrew Friedman DOB: [birth year] MRN: [id number]   Date of Service  02/22/2019  HPI/Events of Note  12 Lead EKG difficult to interpret due to baseline wonder  eICU Interventions  Repeat EKG, Troponin x 3        Yusuf Yu U Claude Swendsen 02/22/2019, 1:08 AM

## 2019-02-22 NOTE — Progress Notes (Signed)
Turned patient's head and rotated arms.  Tolerated well.

## 2019-02-22 NOTE — Progress Notes (Signed)
Pt head and arms repositioned. ETT remains secure at 24cm at the lip. Pt tolerated well, RT will continue to monitor.

## 2019-02-23 DIAGNOSIS — Z9911 Dependence on respirator [ventilator] status: Secondary | ICD-10-CM

## 2019-02-23 LAB — POCT I-STAT 7, (LYTES, BLD GAS, ICA,H+H)
Acid-Base Excess: 1 mmol/L (ref 0.0–2.0)
Bicarbonate: 31.2 mmol/L — ABNORMAL HIGH (ref 20.0–28.0)
Calcium, Ion: 1.25 mmol/L (ref 1.15–1.40)
HCT: 41 % (ref 39.0–52.0)
Hemoglobin: 13.9 g/dL (ref 13.0–17.0)
O2 Saturation: 98 %
Patient temperature: 98.6
Potassium: 5.3 mmol/L — ABNORMAL HIGH (ref 3.5–5.1)
Sodium: 144 mmol/L (ref 135–145)
TCO2: 33 mmol/L — ABNORMAL HIGH (ref 22–32)
pCO2 arterial: 74.2 mmHg (ref 32.0–48.0)
pH, Arterial: 7.231 — ABNORMAL LOW (ref 7.350–7.450)
pO2, Arterial: 133 mmHg — ABNORMAL HIGH (ref 83.0–108.0)

## 2019-02-23 LAB — CBC WITH DIFFERENTIAL/PLATELET
Abs Immature Granulocytes: 0.14 10*3/uL — ABNORMAL HIGH (ref 0.00–0.07)
Basophils Absolute: 0 10*3/uL (ref 0.0–0.1)
Basophils Relative: 0 %
Eosinophils Absolute: 0 10*3/uL (ref 0.0–0.5)
Eosinophils Relative: 0 %
HCT: 43.2 % (ref 39.0–52.0)
Hemoglobin: 13.7 g/dL (ref 13.0–17.0)
Immature Granulocytes: 1 %
Lymphocytes Relative: 1 %
Lymphs Abs: 0.2 10*3/uL — ABNORMAL LOW (ref 0.7–4.0)
MCH: 30 pg (ref 26.0–34.0)
MCHC: 31.7 g/dL (ref 30.0–36.0)
MCV: 94.7 fL (ref 80.0–100.0)
Monocytes Absolute: 0.1 10*3/uL (ref 0.1–1.0)
Monocytes Relative: 1 %
Neutro Abs: 18.5 10*3/uL — ABNORMAL HIGH (ref 1.7–7.7)
Neutrophils Relative %: 97 %
Platelets: 276 10*3/uL (ref 150–400)
RBC: 4.56 MIL/uL (ref 4.22–5.81)
RDW: 13.2 % (ref 11.5–15.5)
WBC: 19 10*3/uL — ABNORMAL HIGH (ref 4.0–10.5)
nRBC: 0 % (ref 0.0–0.2)

## 2019-02-23 LAB — COMPREHENSIVE METABOLIC PANEL
ALT: 20 U/L (ref 0–44)
AST: 36 U/L (ref 15–41)
Albumin: 2.4 g/dL — ABNORMAL LOW (ref 3.5–5.0)
Alkaline Phosphatase: 74 U/L (ref 38–126)
Anion gap: 10 (ref 5–15)
BUN: 63 mg/dL — ABNORMAL HIGH (ref 8–23)
CO2: 28 mmol/L (ref 22–32)
Calcium: 8.5 mg/dL — ABNORMAL LOW (ref 8.9–10.3)
Chloride: 105 mmol/L (ref 98–111)
Creatinine, Ser: 1.11 mg/dL (ref 0.61–1.24)
GFR calc Af Amer: >60 mL/min (ref >60)
GFR calc non Af Amer: >60 mL/min (ref >60)
Glucose, Bld: 271 mg/dL — ABNORMAL HIGH (ref 70–99)
Potassium: 5.5 mmol/L — ABNORMAL HIGH (ref 3.5–5.1)
Sodium: 143 mmol/L (ref 135–145)
Total Bilirubin: 0.7 mg/dL (ref 0.3–1.2)
Total Protein: 5.6 g/dL — ABNORMAL LOW (ref 6.5–8.1)

## 2019-02-23 LAB — GLUCOSE, CAPILLARY
Glucose-Capillary: 226 mg/dL — ABNORMAL HIGH (ref 70–99)
Glucose-Capillary: 215 mg/dL — ABNORMAL HIGH (ref 70–99)
Glucose-Capillary: 236 mg/dL — ABNORMAL HIGH (ref 70–99)
Glucose-Capillary: 200 mg/dL — ABNORMAL HIGH (ref 70–99)
Glucose-Capillary: 259 mg/dL — ABNORMAL HIGH (ref 70–99)

## 2019-02-23 LAB — MAGNESIUM: Magnesium: 2 mg/dL (ref 1.7–2.4)

## 2019-02-23 LAB — D-DIMER, QUANTITATIVE: D-Dimer, Quant: 11.86 ug/mL-FEU — ABNORMAL HIGH (ref 0.00–0.50)

## 2019-02-23 LAB — FERRITIN: Ferritin: 625 ng/mL — ABNORMAL HIGH (ref 24–336)

## 2019-02-23 MED ORDER — INSULIN DETEMIR 100 UNIT/ML ~~LOC~~ SOLN
10.0000 [IU] | Freq: Every day | SUBCUTANEOUS | Status: DC
  Administered 2019-02-23 – 2019-02-25 (×3): 10 [IU] via SUBCUTANEOUS
  Filled 2019-02-23 (×3): qty 0.1

## 2019-02-23 MED ORDER — SODIUM POLYSTYRENE SULFONATE 15 GM/60ML PO SUSP
15.0000 g | Freq: Once | ORAL | Status: AC
  Administered 2019-02-23: 15 g
  Filled 2019-02-23: qty 60

## 2019-02-23 MED ORDER — POLYETHYLENE GLYCOL 3350 17 G PO PACK
17.0000 g | PACK | Freq: Two times a day (BID) | ORAL | Status: DC
  Administered 2019-02-23 – 2019-02-25 (×4): 17 g via ORAL
  Filled 2019-02-23 (×5): qty 1

## 2019-02-23 NOTE — Plan of Care (Addendum)
Pt will stay supine throughout the day per provider. Wife was updated and all questions were answered. Will continue to wean sedation as tolerated.   Pt belongings found and placed in a bag at bedside:  - glasses - dentures - sweater - pajamas - underwear - socks - slippers  Problem: Education: Goal: Knowledge of General Education information will improve Description: Including pain rating scale, medication(s)/side effects and non-pharmacologic comfort measures Outcome: Progressing   Problem: Health Behavior/Discharge Planning: Goal: Ability to manage health-related needs will improve Outcome: Progressing   Problem: Clinical Measurements: Goal: Ability to maintain clinical measurements within normal limits will improve Outcome: Progressing Goal: Will remain free from infection Outcome: Progressing Goal: Diagnostic test results will improve Outcome: Progressing Goal: Respiratory complications will improve Outcome: Progressing Goal: Cardiovascular complication will be avoided Outcome: Progressing   Problem: Activity: Goal: Risk for activity intolerance will decrease Outcome: Progressing   Problem: Nutrition: Goal: Adequate nutrition will be maintained Outcome: Progressing   Problem: Coping: Goal: Level of anxiety will decrease Outcome: Progressing   Problem: Elimination: Goal: Will not experience complications related to bowel motility Outcome: Progressing Goal: Will not experience complications related to urinary retention Outcome: Progressing   Problem: Pain Managment: Goal: General experience of comfort will improve Outcome: Progressing   Problem: Safety: Goal: Ability to remain free from injury will improve Outcome: Progressing   Problem: Skin Integrity: Goal: Risk for impaired skin integrity will decrease Outcome: Progressing

## 2019-02-23 NOTE — Progress Notes (Signed)
TF residual remains 365ml despite being off of TF. Dr. Vanita Ingles states to keep the TF off and do not cover his sugars at this time and they can reevaluate TF needs in am.

## 2019-02-23 NOTE — Progress Notes (Addendum)
NAME:  Andrew Friedman, MRN:  [id number], DOB:  1930/08/07, LOS: 5 ADMISSION DATE:  03/07/2019, CONSULTATION DATE:  02/17/2019 REFERRING MD:  [facility], CHIEF COMPLAINT:  SOB   Brief History   85 year old male admitted 12/7 with respiratory distress in the setting of Covid 19 pneumonia.  Patient transferred from [facility] intubated, sedated.  Past Medical History  Osteoarthritis Hyperlipidemia BPH Right Herniorrhaphy  Pulmonary Embolism - 2018, large left PE, RV/LV ratio 1.3 suggestive of RH strain, fatty infiltration of liver, s/p 6 months anticoagulation LLL Pulmonary Nodule - seen on 2018 CTA chest, 35mm  COVID-19 - positive 02/16/19 at PCP  [place] [facility] Events   12/07 Admit to [facility] from [facility]  12/08 Convalescent plasma, episodes of brady to 30's 12/09 Episode of bradycardia, polymorphic VT / rhythm disturbances 12/12 Slow AF/Aflutter on monitor, remains on pressors  Consults:    Procedures:  ETT 12/7 >>  L [place] TLC 12/7 >>   Significant Diagnostic Tests:   EKG 12/8 >> SB  Micro Data:  COVID 12/7 (OSH) >> positive  BCx2 12/7 >>  Tracheal Aspirate 12/7 >> normal flora, fungus isolated, suspected contaminant ? Urinalysis 12/7 >> negative, mild proteinuria  Quantiferon Gold 12/7 >> negative Acute Hepatitis Panel 12/7 >> negative  BCx2 12/11 >>  Tracheal aspirate 12/11 >> abundant gram-positive organisms, pending UA 12/11 >> many bacteria, WBC 0-5   Antimicrobials:  Redesivir 12/7-12/11 Meropenem 12/11 >>  Vanco 12/11 >>  Voriconazole 12/11 >>  Interim history/subjective:  Remains on levophed, vasopressin Slow AF/Aflutter on monitor  P/F at 0230 - 166  Objective   Blood pressure 127/66, pulse (!) 54, temperature 98.3 F (36.8 C), temperature source Axillary, resp. rate (!) 25, height 5\' 10"  (1.778 m), weight 77 kg, SpO2 97 %. CVP:  [7 mmHg-27 mmHg] 9 mmHg  Vent Mode: PRVC FiO2 (%):  [80 %-100 %] 80 % Set Rate:  [20 bmp-25  bmp] 25 bmp Vt Set:  [520 mL] 520 mL PEEP:  [16 cmH20-18 cmH20] 16 cmH20 Plateau Pressure:  [30 cmH20-34 cmH20] 30 cmH20   Intake/Output Summary (Last 24 hours) at 02/23/2019 1036 Last data filed at 02/23/2019 0800 Gross per 24 hour  Intake 2846.29 ml  Output 1070 ml  Net 1776.29 ml   Filed Weights   02/20/19 0500 02/21/19 0500 02/23/19 0600  Weight: 75.5 kg 75 kg 77 kg    Examination: General: elderly gentleman lying in bed on vent, critically ill appearing    HEENT: MM pink/moist, ETT Neuro: sedate  CV: s1s2 rrr, no m/r/g PULM:  Non-labored/synchronous, lungs bilaterally coarse / distant  GI: soft, bsx4 active  Extremities: warm/dry, no edema  Skin: no rashes or lesions  CXR 12/11 >> images personally reviewed, worsening of bilateral opacities  No images 12/12   Resolved Hospital Problem list   Polymorphic VT - resolved  Assessment & Plan:   Acute Hypoxemic Respiratory Failure in the setting of COVID-19 Pneumonia Moderate ARDS Received convalescent plasma, actemera, remdesivir -low Vt ventilation 4-8cc/kg -goal plateau pressure <30, driving pressure <42 cm H2O -target PaO2 55-65, titrate PEEP/FiO2 per ARDS protocol  -Pf ratio 166, hold further prone positioning  -goal CVP <4, diuresis as necessary -VAP prevention measures  -follow intermittent CXR  -D6/10 decadron   Shock, suspected septic with pulmonary source Bradycardia AF/AFlutter  -continue tele monitoring  -wean vasopressors off for MAP >65 -empiric abx, mold coverage as above  -ensure K>4, Mg+ >2 -no CPR in event of arrest  -slow AF/flutter, monitor  for now, if remains in AF may need rhythm controlling agent  Hypernatremia -free water   Hyperkalemia  -kayexalate PT x1   Need for Sedation secondary to Mechanical Ventilation  -PAD protocol -RASS Goal: -3 with vent synchrony  -goal to move away from paralytics as able -continue fentanyl / versed   Hyperlipidemia -continue fenofibrate -hold  statin with voriconazole   Hyperglycemia  -Add levemir  -SSI   BPH -continue finesteride   At Risk Malnutrition  -TF per nutrition   GOC Discussed with wife via phone regarding plan of care 12/9. LIMITED CODE. No CPR.     Best practice:  Diet: TF  Pain/Anxiety/Delirium protocol (if indicated): yes VAP protocol (if indicated): yes  DVT prophylaxis: lovenox  GI prophylaxis: pepcid  Glucose control: SSI, sensitive scale  Mobility: BR  Code Status: Intubation only, current support.  Family Communication: Wife - Lucila MaineDenzel Rezabek [phone] updated via phone 12/12   Disposition: ICU  Labs   CBC: Recent Labs  Lab 02/19/19 0355 02/20/19 0500 02/21/19 0520 02/22/19 0108 02/22/19 0449 02/22/19 1654 02/23/19 0237 02/23/19 0330  WBC 3.4* 4.0 8.3 11.9*  --   --   --  19.0*  NEUTROABS 2.8 3.2 7.6 11.4*  --   --   --  18.5*  HGB 12.0* 11.8* 13.6 13.8 13.9 13.9 13.9 13.7  HCT 38.0* 38.3* 43.4 43.9 41.0 41.0 41.0 43.2  MCV 93.8 95.0 94.1 95.2  --   --   --  94.7  PLT 301 327 434* 368  --   --   --  276    Basic Metabolic Panel: Recent Labs  Lab 02/14/2019 1450 02/19/19 0355 02/19/19 1700 02/20/19 0500 02/20/19 1420 02/21/19 0520 02/22/19 0108 02/22/19 0449 02/22/19 1654 02/23/19 0237 02/23/19 0330  NA 147* 146*  --  144 147* 146* 144 141 144 144 143  K 4.9 5.1  --  5.0 4.7 5.0 4.8 4.8 4.4 5.3* 5.5*  CL 108 108  --  107 108 103 101  --   --   --  105  CO2 28 28  --  30 31 33* 31  --   --   --  28  GLUCOSE 129* 194*  --  235* 144* 172* 238*  --   --   --  271*  BUN 21 36*  --  47* 44* 45* 61*  --   --   --  63*  CREATININE 1.16 1.19  --  0.81 0.67 0.81 1.20  --   --   --  1.11  CALCIUM 8.5* 8.8*  --  9.1 8.9 9.1 9.0  --   --   --  8.5*  MG 1.7 1.9 2.1 2.0 2.2  --  1.9  --   --   --  2.0  PHOS 3.8 3.2 3.8 3.6 4.0  --   --   --   --   --   --    GFR: Estimated Creatinine Clearance: 50.2 mL/min (by C-G formula based on SCr of 1.11 mg/dL). Recent Labs  Lab  03/02/2019 1450 02/20/19 0500 02/21/19 0520 02/22/19 0108 02/23/19 0330  PROCALCITON 0.24  --   --   --   --   WBC 7.3 4.0 8.3 11.9* 19.0*    Liver Function Tests: Recent Labs  Lab 02/19/19 0355 02/20/19 0500 02/21/19 0520 02/22/19 0108 02/23/19 0330  AST 33 32 34 40 36  ALT 17 20 22 22 20   ALKPHOS 38 43 48 48 74  BILITOT 0.3 0.7 0.4 0.5 0.7  PROT 5.9* 6.0* 6.5 5.8* 5.6*  ALBUMIN 2.5* 2.7* 2.8* 2.7* 2.4*   No results for input(s): LIPASE, AMYLASE in the last 168 hours. No results for input(s): AMMONIA in the last 168 hours.  ABG    Component Value Date/Time   PHART 7.231 (L) 02/23/2019 0237   PCO2ART 74.2 (HH) 02/23/2019 0237   PO2ART 133.0 (H) 02/23/2019 0237   HCO3 31.2 (H) 02/23/2019 0237   TCO2 33 (H) 02/23/2019 0237   O2SAT 98.0 02/23/2019 0237     Coagulation Profile: No results for input(s): INR, PROTIME in the last 168 hours.  Cardiac Enzymes: No results for input(s): CKTOTAL, CKMB, CKMBINDEX, TROPONINI in the last 168 hours.  HbA1C: Hgb A1c MFr Bld  Date/Time Value Ref Range Status  03/10/19 02:50 PM 6.2 (H) 4.8 - 5.6 % Final    Comment:    (NOTE) Pre diabetes:          5.7%-6.4% Diabetes:              >6.4% Glycemic control for   <7.0% adults with diabetes     CBG: Recent Labs  Lab 02/22/19 1554 02/22/19 2006 02/22/19 2346 02/23/19 0355 02/23/19 0745  GLUCAP 144* 159* 150* 226* 215*     Critical care time: 30 minutes     Canary Brim, MSN, NP-C [facility] 02/23/2019, 10:36 AM   Please see [email or web address] for pager details.    PCCM attending:  This is an 85 year old gentleman admitted for COVID-19 ARDS related to COVID-19 bilateral pneumonia requiring intubation mechanical ventilation.  Overnight patient stable.  Ratio 166 currently no plans to prone today.  BP 108/60   Pulse (!) 56   Temp 98.3 F (36.8 C) (Axillary)   Resp (!) 25   Ht 5\' 10"  (1.778 m)   Wt 77 kg   SpO2 92%   BMI 24.36 kg/m    General: Elderly male intubated on mechanical ventilation, critically ill HEENT: Trachea midline endotracheal tube in place Heart: Regular rate and rhythm, S1-S2 Lungs: Clear to auscultation, bilateral ventilated breath sounds Extremities: No significant edema  Labs reviewed: Potassium 5.5, creatinine 1.1, white blood cell count 19, D-dimer 11.8 Respiratory culture 02/22/2019: Abundant gram-positive cocci 03-10-19: Respiratory culture, mold questionable contaminant  Assessment: Acute hypoxemic respiratory failure requiring intubation mechanical ventilation secondary to COVID-19 pneumonia, bilateral lungs, causing acute respiratory distress syndrome Bilateral bacterial pneumonia, gram-positive organism, possible fungal disease however likely contaminant Septic shock secondary to above Bradycardia Atrial flutter, atrial fibrillation rate controlled Hypernatremia Hyperkalemia Hyperglycemia, steroid-induced, sepsis induced  Plan: Patient remains in the intensive care unit for close hemodynamic respiratory support. Remains supine positioning today, avoiding paralytics, continue fentanyl and Versed Wean from norepinephrine to maintain mean arterial pressure greater than 65 mmHg Discontinue dopamine due to various arrhythmias Correcting electrolytes, dose of Kayexalate Continue meropenem, vancomycin and voriconazole until cultures speciate, awaiting 02/22/2019 respiratory culture to speciate gram-positive organisms.  This patient is critically ill with multiple organ system failure; which, requires frequent high complexity decision making, assessment, support, evaluation, and titration of therapies. This was completed through the application of advanced monitoring technologies and extensive interpretation of multiple databases. During this encounter critical care time was devoted to patient care services described in this note for 32 minutes.  14/01/2019, DO [place] [facility] 02/23/2019 12:14 PM

## 2019-02-23 NOTE — Progress Notes (Signed)
RT obtained ABG 7.23 CO2 74.2 O2 133 and Bicarb 31.2 RT increased RR from 20 to 25 on ventilator post ABG. Pt not breathing over settings, RT to obtain ABG post increase in RR

## 2019-02-23 NOTE — Progress Notes (Signed)
TF residual over 1 liter. TF placed on hold. Vanita Ingles, MD paged. Awaiting orders.

## 2019-02-23 NOTE — Progress Notes (Signed)
Vera called back and states to recheck residual at 0000. If greater than 213ml comes back call him for further instructions.

## 2019-02-24 ENCOUNTER — Inpatient Hospital Stay (HOSPITAL_COMMUNITY): Payer: Medicare Other

## 2019-02-24 DIAGNOSIS — J156 Pneumonia due to other aerobic Gram-negative bacteria: Secondary | ICD-10-CM

## 2019-02-24 LAB — BASIC METABOLIC PANEL
Anion gap: 9 (ref 5–15)
Anion gap: 12 (ref 5–15)
BUN: 73 mg/dL — ABNORMAL HIGH (ref 8–23)
BUN: 70 mg/dL — ABNORMAL HIGH (ref 8–23)
CO2: 31 mmol/L (ref 22–32)
CO2: 33 mmol/L — ABNORMAL HIGH (ref 22–32)
Calcium: 8.7 mg/dL — ABNORMAL LOW (ref 8.9–10.3)
Calcium: 8.6 mg/dL — ABNORMAL LOW (ref 8.9–10.3)
Chloride: 108 mmol/L (ref 98–111)
Chloride: 106 mmol/L (ref 98–111)
Creatinine, Ser: 0.97 mg/dL (ref 0.61–1.24)
Creatinine, Ser: 1.02 mg/dL (ref 0.61–1.24)
GFR calc Af Amer: >60 mL/min (ref >60)
GFR calc Af Amer: >60 mL/min (ref >60)
GFR calc non Af Amer: >60 mL/min (ref >60)
GFR calc non Af Amer: >60 mL/min (ref >60)
Glucose, Bld: 204 mg/dL — ABNORMAL HIGH (ref 70–99)
Glucose, Bld: 162 mg/dL — ABNORMAL HIGH (ref 70–99)
Potassium: 5.3 mmol/L — ABNORMAL HIGH (ref 3.5–5.1)
Potassium: 4.5 mmol/L (ref 3.5–5.1)
Sodium: 148 mmol/L — ABNORMAL HIGH (ref 135–145)
Sodium: 151 mmol/L — ABNORMAL HIGH (ref 135–145)

## 2019-02-24 LAB — POCT I-STAT 7, (LYTES, BLD GAS, ICA,H+H)
Acid-Base Excess: 5 mmol/L — ABNORMAL HIGH (ref 0.0–2.0)
Bicarbonate: 31.9 mmol/L — ABNORMAL HIGH (ref 20.0–28.0)
Calcium, Ion: 1.27 mmol/L (ref 1.15–1.40)
HCT: 36 % — ABNORMAL LOW (ref 39.0–52.0)
Hemoglobin: 12.2 g/dL — ABNORMAL LOW (ref 13.0–17.0)
O2 Saturation: 91 %
Patient temperature: 97.9
Potassium: 5.2 mmol/L — ABNORMAL HIGH (ref 3.5–5.1)
Sodium: 148 mmol/L — ABNORMAL HIGH (ref 135–145)
TCO2: 34 mmol/L — ABNORMAL HIGH (ref 22–32)
pCO2 arterial: 56.6 mmHg — ABNORMAL HIGH (ref 32.0–48.0)
pH, Arterial: 7.357 (ref 7.350–7.450)
pO2, Arterial: 65 mmHg — ABNORMAL LOW (ref 83.0–108.0)

## 2019-02-24 LAB — GLUCOSE, CAPILLARY
Glucose-Capillary: 119 mg/dL — ABNORMAL HIGH (ref 70–99)
Glucose-Capillary: 129 mg/dL — ABNORMAL HIGH (ref 70–99)
Glucose-Capillary: 136 mg/dL — ABNORMAL HIGH (ref 70–99)
Glucose-Capillary: 139 mg/dL — ABNORMAL HIGH (ref 70–99)
Glucose-Capillary: 159 mg/dL — ABNORMAL HIGH (ref 70–99)
Glucose-Capillary: 185 mg/dL — ABNORMAL HIGH (ref 70–99)
Glucose-Capillary: 218 mg/dL — ABNORMAL HIGH (ref 70–99)

## 2019-02-24 LAB — CBC
HCT: 38.9 % — ABNORMAL LOW (ref 39.0–52.0)
Hemoglobin: 12.3 g/dL — ABNORMAL LOW (ref 13.0–17.0)
MCH: 30 pg (ref 26.0–34.0)
MCHC: 31.6 g/dL (ref 30.0–36.0)
MCV: 94.9 fL (ref 80.0–100.0)
Platelets: 198 10*3/uL (ref 150–400)
RBC: 4.1 MIL/uL — ABNORMAL LOW (ref 4.22–5.81)
RDW: 13.6 % (ref 11.5–15.5)
WBC: 13.3 10*3/uL — ABNORMAL HIGH (ref 4.0–10.5)
nRBC: 0 % (ref 0.0–0.2)

## 2019-02-24 MED ORDER — SODIUM CHLORIDE 0.9 % IV SOLN
1.0000 g | Freq: Three times a day (TID) | INTRAVENOUS | Status: DC
  Administered 2019-02-24 – 2019-02-28 (×12): 1 g via INTRAVENOUS
  Filled 2019-02-24 (×14): qty 1

## 2019-02-24 MED ORDER — FUROSEMIDE 10 MG/ML IJ SOLN
20.0000 mg | Freq: Four times a day (QID) | INTRAMUSCULAR | Status: AC
  Administered 2019-02-24 – 2019-02-25 (×3): 20 mg via INTRAVENOUS
  Filled 2019-02-24 (×3): qty 2

## 2019-02-24 MED ORDER — SODIUM POLYSTYRENE SULFONATE 15 GM/60ML PO SUSP
30.0000 g | Freq: Once | ORAL | Status: AC
  Administered 2019-02-24: 30 g
  Filled 2019-02-24: qty 120

## 2019-02-24 NOTE — Plan of Care (Signed)

## 2019-02-24 NOTE — Progress Notes (Signed)
MD made aware of HR in the 30's, but other VSS. Instructed to wean off vasopressin first and leave levo infusing.

## 2019-02-24 NOTE — Progress Notes (Addendum)
NAME:  Andrew Friedman, MRN:  [id number], DOB:  December 07, 1930, LOS: 6 ADMISSION DATE:  03/10/2019, CONSULTATION DATE:  March 01, 2019 REFERRING MD:  [facility], CHIEF COMPLAINT:  SOB   Brief History   85 year old male admitted 12/7 with respiratory distress in the setting of Covid 19 pneumonia.  Patient transferred from [facility] intubated, sedated.  Past Medical History  Osteoarthritis Hyperlipidemia BPH Right Herniorrhaphy  Pulmonary Embolism - 2018, large left PE, RV/LV ratio 1.3 suggestive of RH strain, fatty infiltration of liver, s/p 6 months anticoagulation LLL Pulmonary Nodule - seen on 2018 CTA chest, 84mm  COVID-19 - positive 02/16/19 at PCP  Significant Hospital Events   12/07 Admit to [facility] from [facility]  12/08 Convalescent plasma, episodes of brady to 30's 12/09 Episode of bradycardia, polymorphic VT / rhythm disturbances 12/12 Slow AF/Aflutter on monitor, remains on pressors 12/13 Off pressors, HR in 40's / back to SR  Consults:    Procedures:  ETT 12/7 >>  L [place] TLC 12/7 >>   Significant Diagnostic Tests:   EKG 12/8 >> SB  Micro Data:  COVID 12/7 (OSH) >> positive  BCx2 12/7 >>  Tracheal Aspirate 12/7 >> normal flora, fungus isolated, suspected contaminant ? Urinalysis 12/7 >> negative, mild proteinuria  Quantiferon Gold 12/7 >> negative Acute Hepatitis Panel 12/7 >> negative  BCx2 12/11 >>  Tracheal aspirate 12/11 >> abundant moraxella catarrhalis, beta lactam positive >> UA 12/11 >> many bacteria, WBC 0-5   Antimicrobials:  Redesivir 12/7-12/11 Meropenem 12/11 >>  Vanco 12/11 >> 12/13 Voriconazole 12/11 >>    Interim history/subjective:  Off pressors  Moraxella growing in tracheal aspirate, sensitivities pending HR remains in 40's / SB  Objective   Blood pressure 139/64, pulse (!) 49, temperature 98.3 F (36.8 C), temperature source Oral, resp. rate (!) 25, height 5\' 10"  (1.778 m), weight 77 kg, SpO2 92 %.    Vent Mode:  PRVC FiO2 (%):  [80 %] 80 % Set Rate:  [25 bmp] 25 bmp Vt Set:  [520 mL] 520 mL PEEP:  [14 cmH20-16 cmH20] 14 cmH20 Plateau Pressure:  [26 cmH20-36 cmH20] 28 cmH20   Intake/Output Summary (Last 24 hours) at 02/24/2019 1326 Last data filed at 02/24/2019 0730 Gross per 24 hour  Intake 1635.12 ml  Output 2105 ml  Net -469.88 ml   Filed Weights   02/20/19 0500 02/21/19 0500 02/23/19 0600  Weight: 75.5 kg 75 kg 77 kg    Examination: General: elderly male lying in bed on vent, critically ill appearing  HEENT: MM pink/moist, ETT,pupils 7mm reactive  Neuro: sedate  CV: s1s2 RRR, brady in 40's on monitor, no m/r/g PULM:  Non-labored on vent, coarse breath sounds bilaterally, diminished bases  GI: soft, bsx4 active  Extremities: warm/dry, trace generalized edema  Skin: no rashes or lesions  CXR 12/13 >> images personally reviewed, R>L opacities   Resolved Hospital Problem list   Polymorphic VT - resolved  Assessment & Plan:   Acute Hypoxemic Respiratory Failure in the setting of COVID-19 Pneumonia Moderate ARDS Received convalescent plasma, actemera, remdesivir -low Vt ventilation 4-8cc/kg -goal plateau pressure <30, driving pressure 1/14 cm <88 -target PaO2 55-65, titrate PEEP/FiO2 per ARDS protocol  -goal CVP <4, diuresis as necessary -VAP prevention measures  -follow intermittent CXR  -D7/10 decadron  Shock, suspected septic with pulmonary source Bradycardia AF/AFlutter  -tele montioring -sensitive to sedation and pressors (worsening brady and PVC's/irritability / AF with pressors) -continue empiric mold coverage for now  -stop vanco  -continue  meropenem until sensitivities returned  -keep k>4, Mg>2 -no CPR in the event of arrest  Hypernatremia -free water   Hyperkalemia  -repeat kayexalate   Need for Sedation secondary to Mechanical Ventilation  -PAD protocol  -RASS Goal: -2 -reduce sedation as able   Hyperlipidemia -continue fenofibrate  -hold statin  with voriconazole  Hyperglycemia  -levemir + SSI  BPH -continue finesteride   At Risk Malnutrition  -TF per nutrition   GOC Discussed with wife via phone regarding plan of care 12/9. LIMITED CODE. No CPR.     Best practice:  Diet: TF  Pain/Anxiety/Delirium protocol (if indicated): yes VAP protocol (if indicated): yes  DVT prophylaxis: lovenox  GI prophylaxis: pepcid  Glucose control: SSI, sensitive scale  Mobility: BR  Code Status: Intubation only, current support.  Family Communication: Wife - Blanton Kardell [phone] called for update but she was in the ER (is also positive).  Friend answered her phone > informed pt stable and will return call.   Disposition: ICU  Labs   CBC: Recent Labs  Lab 02/19/19 0355 02/20/19 0500 02/21/19 0520 02/22/19 0108 02/22/19 1654 02/23/19 0237 02/23/19 0330 02/24/19 0455 02/24/19 0635  WBC 3.4* 4.0 8.3 11.9*  --   --  19.0*  --  13.3*  NEUTROABS 2.8 3.2 7.6 11.4*  --   --  18.5*  --   --   HGB 12.0* 11.8* 13.6 13.8 13.9 13.9 13.7 12.2* 12.3*  HCT 38.0* 38.3* 43.4 43.9 41.0 41.0 43.2 36.0* 38.9*  MCV 93.8 95.0 94.1 95.2  --   --  94.7  --  94.9  PLT 301 327 434* 368  --   --  276  --  643    Basic Metabolic Panel: Recent Labs  Lab 02/21/2019 1450 02/19/19 0355 02/19/19 1700 02/20/19 0500 02/20/19 1420 02/21/19 0520 02/22/19 0108 02/22/19 1654 02/23/19 0237 02/23/19 0330 02/24/19 0455 02/24/19 0635  NA 147* 146*  --  144 147* 146* 144 144 144 143 148* 148*  K 4.9 5.1  --  5.0 4.7 5.0 4.8 4.4 5.3* 5.5* 5.2* 5.3*  CL 108 108  --  107 108 103 101  --   --  105  --  108  CO2 28 28  --  30 31 33* 31  --   --  28  --  31  GLUCOSE 129* 194*  --  235* 144* 172* 238*  --   --  271*  --  204*  BUN 21 36*  --  47* 44* 45* 61*  --   --  63*  --  73*  CREATININE 1.16 1.19  --  0.81 0.67 0.81 1.20  --   --  1.11  --  0.97  CALCIUM 8.5* 8.8*  --  9.1 8.9 9.1 9.0  --   --  8.5*  --  8.7*  MG 1.7 1.9 2.1 2.0 2.2  --  1.9  --   --   2.0  --   --   PHOS 3.8 3.2 3.8 3.6 4.0  --   --   --   --   --   --   --    GFR: Estimated Creatinine Clearance: 57.5 mL/min (by C-G formula based on SCr of 0.97 mg/dL). Recent Labs  Lab 02/25/2019 1450 02/21/19 0520 02/22/19 0108 02/23/19 0330 02/24/19 0635  PROCALCITON 0.24  --   --   --   --   WBC 7.3 8.3 11.9* 19.0* 13.3*    Liver  Function Tests: Recent Labs  Lab 02/19/19 0355 02/20/19 0500 02/21/19 0520 02/22/19 0108 02/23/19 0330  AST 33 32 34 40 36  ALT ALKPHOS 38 43 48 48 74  BILITOT 0.3 0.7 0.4 0.5 0.7  PROT 5.9* 6.0* 6.5 5.8* 5.6*  ALBUMIN 2.5* 2.7* 2.8* 2.7* 2.4*   No results for input(s): LIPASE, AMYLASE in the last 168 hours. No results for input(s): AMMONIA in the last 168 hours.  ABG    Component Value Date/Time   PHART 7.357 02/24/2019 0455   PCO2ART 56.6 (H) 02/24/2019 0455   PO2ART 65.0 (L) 02/24/2019 0455   HCO3 31.9 (H) 02/24/2019 0455   TCO2 34 (H) 02/24/2019 0455   O2SAT 91.0 02/24/2019 0455     Coagulation Profile: No results for input(s): INR, PROTIME in the last 168 hours.  Cardiac Enzymes: No results for input(s): CKTOTAL, CKMB, CKMBINDEX, TROPONINI in the last 168 hours.  HbA1C: Hgb A1c MFr Bld  Date/Time Value Ref Range Status  15-Mar-2019 02:50 PM 6.2 (H) 4.8 - 5.6 % Final    Comment:    (NOTE) Pre diabetes:          5.7%-6.4% Diabetes:              >6.4% Glycemic control for   <7.0% adults with diabetes     CBG: Recent Labs  Lab 02/23/19 2028 02/23/19 2349 02/24/19 0449 02/24/19 0809 02/24/19 1206  GLUCAP 259* 185* 218* 159* 136*     Critical care time: 30 minutes     Canary Brim, MSN, NP-C [place] [facility] 02/24/2019, 1:26 PM   Please see [email or web address] for pager details.   Pulmonary critical care attending:  This is an 85 year old gentleman admitted with COVID-19 ARDS related to COVID-19 bilateral pneumonia requiring intubation mechanical ventilation.  No issues  overnight.  Patient remains intubated on mechanical life support.  Able to wean FiO2 requirements and PEEP requirements overnight.  BP 139/64   Pulse (!) 49   Temp 98.3 F (36.8 C) (Oral)   Resp (!) 25   Ht  (1.778 m)   Wt 77 kg   SpO2 92%   BMI 24.36 kg/m   General: Elderly male intubated on mechanical life support, critically ill HEENT: Trachea midline, endotracheal tube in place Heart: Regular rhythm, bradycardic this morning, S1-S2 Lungs: Clear to auscultation bilateral ventilated breath sounds Extremities: No significant edema  Labs reviewed: Potassium 5.3, serum creatinine 0.97 Chest x-ray: Bilateral pulmonary opacities, slightly worse in comparison to previous films. The patient's images have been independently reviewed by me.    Assessment: Acute hypoxemic respiratory failure requiring intubation and mechanical ventilation secondary to COVID-19 pneumonia, bilateral, ARDS secondary to above Bilateral bacterial pneumonia, Moraxella catarrhalis, as sensitivities pending, possible fungal disease versus respiratory contaminant, also with few Burkholderia species Septic shock secondary above Bradycardia, stable Atrial flutter/atrial fibrillation, rate controlled Hyperkalemia Positive cumulative fluid balance Hypoglycemia status, steroid-induced, substance-induced  Plan: Patient remains in the intensive care unit for close hemodynamic respiratory support. Remains in supine positioning today. Slowly decreasing fentanyl. Versed has been discontinued No longer requiring norepinephrine Does have positive cumulative balance will give additional diuresis today. Repeat potassium this evening Continue meropenem, vancomycin discontinued Continue voriconazole Diuresis should help with elevated potassium, recheck basic metabolic panel this afternoon.  This patient is critically ill with multiple organ system failure; which, requires frequent high complexity decision making,  assessment, support, evaluation, and titration of therapies. This was completed through the application  of advanced monitoring technologies and extensive interpretation of multiple databases. During this encounter critical care time was devoted to patient care services described in this note for 33 minutes.   Josephine IgoBradley L Cleaven Demario, DO [place] [facility] 02/24/2019 2:02 PM

## 2019-02-25 ENCOUNTER — Inpatient Hospital Stay (HOSPITAL_COMMUNITY): Payer: Medicare Other

## 2019-02-25 LAB — CBC
HCT: 38.1 % — ABNORMAL LOW (ref 39.0–52.0)
Hemoglobin: 12.2 g/dL — ABNORMAL LOW (ref 13.0–17.0)
MCH: 29.8 pg (ref 26.0–34.0)
MCHC: 32 g/dL (ref 30.0–36.0)
MCV: 93.2 fL (ref 80.0–100.0)
Platelets: 207 10*3/uL (ref 150–400)
RBC: 4.09 MIL/uL — ABNORMAL LOW (ref 4.22–5.81)
RDW: 13.5 % (ref 11.5–15.5)
WBC: 12.6 10*3/uL — ABNORMAL HIGH (ref 4.0–10.5)
nRBC: 0.2 % (ref 0.0–0.2)

## 2019-02-25 LAB — CULTURE, BLOOD (ROUTINE X 2)
Culture: NO GROWTH
Culture: NO GROWTH
Special Requests: ADEQUATE
Special Requests: ADEQUATE

## 2019-02-25 LAB — POCT I-STAT 7, (LYTES, BLD GAS, ICA,H+H)
Acid-Base Excess: 16 mmol/L — ABNORMAL HIGH (ref 0.0–2.0)
Bicarbonate: 42.1 mmol/L — ABNORMAL HIGH (ref 20.0–28.0)
Calcium, Ion: 1.19 mmol/L (ref 1.15–1.40)
HCT: 37 % — ABNORMAL LOW (ref 39.0–52.0)
Hemoglobin: 12.6 g/dL — ABNORMAL LOW (ref 13.0–17.0)
O2 Saturation: 91 %
Potassium: 3.9 mmol/L (ref 3.5–5.1)
Sodium: 151 mmol/L — ABNORMAL HIGH (ref 135–145)
TCO2: 44 mmol/L — ABNORMAL HIGH (ref 22–32)
pCO2 arterial: 56.2 mmHg — ABNORMAL HIGH (ref 32.0–48.0)
pH, Arterial: 7.482 — ABNORMAL HIGH (ref 7.350–7.450)
pO2, Arterial: 58 mmHg — ABNORMAL LOW (ref 83.0–108.0)

## 2019-02-25 LAB — GLUCOSE, CAPILLARY
Glucose-Capillary: 113 mg/dL — ABNORMAL HIGH (ref 70–99)
Glucose-Capillary: 134 mg/dL — ABNORMAL HIGH (ref 70–99)
Glucose-Capillary: 137 mg/dL — ABNORMAL HIGH (ref 70–99)
Glucose-Capillary: 142 mg/dL — ABNORMAL HIGH (ref 70–99)
Glucose-Capillary: 158 mg/dL — ABNORMAL HIGH (ref 70–99)
Glucose-Capillary: 164 mg/dL — ABNORMAL HIGH (ref 70–99)

## 2019-02-25 LAB — BASIC METABOLIC PANEL
Anion gap: 11 (ref 5–15)
BUN: 73 mg/dL — ABNORMAL HIGH (ref 8–23)
CO2: 38 mmol/L — ABNORMAL HIGH (ref 22–32)
Calcium: 8.9 mg/dL (ref 8.9–10.3)
Chloride: 104 mmol/L (ref 98–111)
Creatinine, Ser: 0.93 mg/dL (ref 0.61–1.24)
GFR calc Af Amer: >60 mL/min (ref >60)
GFR calc non Af Amer: >60 mL/min (ref >60)
Glucose, Bld: 184 mg/dL — ABNORMAL HIGH (ref 70–99)
Potassium: 4.3 mmol/L (ref 3.5–5.1)
Sodium: 153 mmol/L — ABNORMAL HIGH (ref 135–145)

## 2019-02-25 MED ORDER — FUROSEMIDE 10 MG/ML IJ SOLN
20.0000 mg | Freq: Four times a day (QID) | INTRAMUSCULAR | Status: AC
  Administered 2019-02-25 – 2019-02-26 (×3): 20 mg via INTRAVENOUS
  Filled 2019-02-25 (×3): qty 2

## 2019-02-25 MED ORDER — FREE WATER
300.0000 mL | Status: DC
  Administered 2019-02-25 – 2019-03-01 (×32): 300 mL

## 2019-02-25 NOTE — Progress Notes (Signed)
Pharmacy Antibiotic Note  Andrew Friedman is a 85 y.o. male admitted on 03/03/2019.  Initiated on broad spectrum antimicrobials including fungal coverage.  Plan: -Continue Meropenem 1g IV q8h -Monitor renal fx, cultures, clinical course   Height: 5\' 10"  (177.8 cm) Weight: 169 lb 12.1 oz (77 kg) IBW/kg (Calculated) : 73  Temp (24hrs), Avg:97.8 F (36.6 C), Min:97.6 F (36.4 C), Max:98.2 F (36.8 C)  Recent Labs  Lab 02/21/19 0520 02/22/19 0108 02/23/19 0330 02/24/19 0635 02/24/19 1815 02/25/19 0526  WBC 8.3 11.9* 19.0* 13.3*  --  12.6*  CREATININE 0.81 1.20 1.11 0.97 1.02 0.93    Estimated Creatinine Clearance: 60 mL/min (by C-G formula based on SCr of 0.93 mg/dL).    Allergies  Allergen Reactions  . Penicillin G Other (See Comments)    Other reaction(s): Other (See Comments) Diarrhea    Antimicrobials this admission: 12/11 vancomycin > 12/13 12/11 meropenem > 12/11 voriconazole > 12/14  Dose adjustments this admission: N/A  Microbiology results: 12/7 resp cx: fungus, probable contaminant, normal flora. 12/8 blood cx: NGF 12/8 MRSA PCR: neg 12/11 blood cx: ngtd 12/7 resp cx: fungus 12/11 resp cx: abundant Moraxella, few burkolderia sp.  Gretta Arab PharmD, BCPS Clinical pharmacist phone 7am- 5pm: [phone] 02/25/2019 5:18 PM

## 2019-02-25 NOTE — Progress Notes (Signed)
ABG results given to Dr. Lake Bells, Verbal order received to prone pt when able. RN notified, RT will continue to monitor.

## 2019-02-25 NOTE — Progress Notes (Signed)
NAME:  Andrew Friedman, MRN:  [id number], DOB:  05-20-1930, LOS: 7 ADMISSION DATE:  03/09/19, CONSULTATION DATE:  12/7 REFERRING MD:  Oval Linsey, CHIEF COMPLAINT:  Dyspnea   Brief History   85 y/o male admitted 12/7 with ARDS due to COVID 19 pneumonia.   Past Medical History  Osteoarthritis Hyperlipidemia BPH Right Herniorrhaphy  Pulmonary Embolism - 2018, large left PE, RV/LV ratio 1.3 suggestive of RH strain, fatty infiltration of liver, s/p 6 months anticoagulation LLL Pulmonary Nodule - seen on 2018 CTA chest, 74mm   Significant Hospital Events   12/07 Admit to [facility] from [facility]  12/08 Convalescent plasma, episodes of brady to 30's 12/09 Episode of bradycardia, polymorphic VT / rhythm disturbances 12/12 Slow AF/Aflutter on monitor, remains on pressors 12/13 Off pressors, HR in 40's / back to SR  Consults:    Procedures:  12/7 ETT >  12/7 left subclavian CVL >   Significant Diagnostic Tests:  12/9 Echo> LVEF 65%, RV normal size and function  Micro Data:  COVID 12/7 (OSH) >> positive  BCx2 12/7 >>  Tracheal Aspirate 12/7 >> normal flora, fungus isolated, suspected contaminant ? Urinalysis 12/7 >> negative, mild proteinuria  Quantiferon Gold 12/7 >> negative Acute Hepatitis Panel 12/7 >> negative  BCx2 12/11 >>  Tracheal aspirate 12/11 >> abundant moraxella catarrhalis, beta lactam positive >> UA 12/11 >> many bacteria, WBC 0-5   Antimicrobials:  Redesivir 12/7-12/11 Meropenem 12/11 >>  Vanco 12/11 >> 12/13 Voriconazole 12/11 >>   12/14  Interim history/subjective:  No acute events Plateau 28 cm H20 PEEP 12, FiO2 80% PaO2 65 yesterday Not proning  Objective   Blood pressure (!) 149/85, pulse 65, temperature 98 F (36.7 C), temperature source Oral, resp. rate (!) 22, height 5\' 10"  (1.778 m), weight 77 kg, SpO2 93 %.    Vent Mode: PRVC FiO2 (%):  [80 %] 80 % Set Rate:  [25 bmp] 25 bmp Vt Set:  [520 mL] 520 mL PEEP:  [12 cmH20-14 cmH20] 12  cmH20 Plateau Pressure:  [27 cmH20-29 cmH20] 28 cmH20   Intake/Output Summary (Last 24 hours) at 02/25/2019 0931 Last data filed at 02/25/2019 0751 Gross per 24 hour  Intake 1066.01 ml  Output 5377 ml  Net -4310.99 ml   Filed Weights   02/20/19 0500 02/21/19 0500 02/23/19 0600  Weight: 75.5 kg 75 kg 77 kg    Examination: General:  In bed on vent HENT: NCAT ETT in place PULM: CTA B, vent supported breathing CV: RRR, no mgr GI: BS+, soft, nontender MSK: normal bulk and tone Neuro: sedated on vent  12/14 CXR images personally reviewed> bilateral airspace disease, ett in place, left effusion  Resolved Hospital Problem list     Assessment & Plan:  ARDS due to COVID 19 pneumonia Continue mechanical ventilation per ARDS protocol Target TVol 6-8cc/kgIBW Target Plateau Pressure < 30cm H20 Target driving pressure less than 15 cm of water Target PaO2 55-65: titrate PEEP/FiO2 per protocol As long as PaO2 to FiO2 ratio is less than 1:150 position in prone position for 16 hours a day Check CVP daily if CVL in place Target CVP less than 4, diurese as necessary Ventilator associated pneumonia prevention protocol 12/14 decadron 8/10 today, check supine ABG, may need prone again, diurese again today  Septic shock due to HCAP: improved, off vasopressors Continue meropenem Monitor hemodynamics Stop voriconazole Monitor off vasopressors  Need for sedation: RASS target -2 Fentanyl infusion, prn versed per PAD protocol  Hyperlipidemia Continue fenofibrate  Hyperglycemia SSI levemir  BPH Finasteride  Hypernatremia Increase free water today  Best practice:  Diet: tube feeding Pain/Anxiety/Delirium protocol (if indicated): as above VAP protocol (if indicated): yes DVT prophylaxis: lovenox  GI prophylaxis: famotidine Glucose control: SSI Mobility: SSI Code Status: Limited code blue, DNR if arrests, full medical support otherwise Family Communication: I called his wife  Denzal today.  I expressed my concern that he may not survive because he is [age] and hasn't made much progress on the vent, but that some things had improved in the last few days. She voiced understanding.  I recommended re-assessing his overall condition in about a week. Disposition: remain in ICU  Labs   CBC: Recent Labs  Lab 02/19/19 0355 02/20/19 0500 02/21/19 0520 02/22/19 0108 02/23/19 0237 02/23/19 0330 02/24/19 0455 02/24/19 0635 02/25/19 0526  WBC 3.4* 4.0 8.3 11.9*  --  19.0*  --  13.3* 12.6*  NEUTROABS 2.8 3.2 7.6 11.4*  --  18.5*  --   --   --   HGB 12.0* 11.8* 13.6 13.8 13.9 13.7 12.2* 12.3* 12.2*  HCT 38.0* 38.3* 43.4 43.9 41.0 43.2 36.0* 38.9* 38.1*  MCV 93.8 95.0 94.1 95.2  --  94.7  --  94.9 93.2  PLT 301 327 434* 368  --  276  --  198 207    Basic Metabolic Panel: Recent Labs  Lab 02/14/2019 1450 02/19/19 0355 02/19/19 1700 02/20/19 0500 02/20/19 1420 02/22/19 0108 02/23/19 0330 02/24/19 0455 02/24/19 0635 02/24/19 1815 02/25/19 0526  NA 147* 146*  --  144 147* 144 143 148* 148* 151* 153*  K 4.9 5.1  --  5.0 4.7 4.8 5.5* 5.2* 5.3* 4.5 4.3  CL 108 108  --  107 108 101 105  --  108 106 104  CO2 28 28  --  30 31 31 28   --  31 33* 38*  GLUCOSE 129* 194*  --  235* 144* 238* 271*  --  204* 162* 184*  BUN 21 36*  --  47* 44* 61* 63*  --  73* 70* 73*  CREATININE 1.16 1.19  --  0.81 0.67 1.20 1.11  --  0.97 1.02 0.93  CALCIUM 8.5* 8.8*  --  9.1 8.9 9.0 8.5*  --  8.7* 8.6* 8.9  MG 1.7 1.9 2.1 2.0 2.2 1.9 2.0  --   --   --   --   PHOS 3.8 3.2 3.8 3.6 4.0  --   --   --   --   --   --    GFR: Estimated Creatinine Clearance: 60 mL/min (by C-G formula based on SCr of 0.93 mg/dL). Recent Labs  Lab 02/17/2019 1450 02/22/19 0108 02/23/19 0330 02/24/19 0635 02/25/19 0526  PROCALCITON 0.24  --   --   --   --   WBC 7.3 11.9* 19.0* 13.3* 12.6*    Liver Function Tests: Recent Labs  Lab 02/19/19 0355 02/20/19 0500 02/21/19 0520 02/22/19 0108 02/23/19 0330    AST 33 32 34 40 36  ALT 17 20 22 22 20   ALKPHOS 38 43 48 48 74  BILITOT 0.3 0.7 0.4 0.5 0.7  PROT 5.9* 6.0* 6.5 5.8* 5.6*  ALBUMIN 2.5* 2.7* 2.8* 2.7* 2.4*   No results for input(s): LIPASE, AMYLASE in the last 168 hours. No results for input(s): AMMONIA in the last 168 hours.  ABG    Component Value Date/Time   PHART 7.357 02/24/2019 0455   PCO2ART 56.6 (H) 02/24/2019 0455  PO2ART 65.0 (L) 02/24/2019 0455   HCO3 31.9 (H) 02/24/2019 0455   TCO2 34 (H) 02/24/2019 0455   O2SAT 91.0 02/24/2019 0455     Coagulation Profile: No results for input(s): INR, PROTIME in the last 168 hours.  Cardiac Enzymes: No results for input(s): CKTOTAL, CKMB, CKMBINDEX, TROPONINI in the last 168 hours.  HbA1C: Hgb A1c MFr Bld  Date/Time Value Ref Range Status  02-21-19 02:50 PM 6.2 (H) 4.8 - 5.6 % Final    Comment:    (NOTE) Pre diabetes:          5.7%-6.4% Diabetes:              >6.4% Glycemic control for   <7.0% adults with diabetes     CBG: Recent Labs  Lab 02/24/19 1206 02/24/19 1535 02/24/19 1944 02/24/19 2340 02/25/19 0756  GLUCAP 136* 129* 139* 119* 164*     Critical care time: 31 minutes     Heber [place], MD [facility] Pager: [phone] Cell: [phone] If no response, call [phone]

## 2019-02-26 ENCOUNTER — Inpatient Hospital Stay: Payer: Self-pay

## 2019-02-26 ENCOUNTER — Inpatient Hospital Stay (HOSPITAL_COMMUNITY): Payer: Medicare Other

## 2019-02-26 ENCOUNTER — Telehealth: Payer: Self-pay | Admitting: Emergency Medicine

## 2019-02-26 LAB — BASIC METABOLIC PANEL
Anion gap: 11 (ref 5–15)
BUN: 72 mg/dL — ABNORMAL HIGH (ref 8–23)
CO2: 44 mmol/L — ABNORMAL HIGH (ref 22–32)
Calcium: 8.6 mg/dL — ABNORMAL LOW (ref 8.9–10.3)
Chloride: 95 mmol/L — ABNORMAL LOW (ref 98–111)
Creatinine, Ser: 0.84 mg/dL (ref 0.61–1.24)
GFR calc Af Amer: >60 mL/min (ref >60)
GFR calc non Af Amer: >60 mL/min (ref >60)
Glucose, Bld: 164 mg/dL — ABNORMAL HIGH (ref 70–99)
Potassium: 3.9 mmol/L (ref 3.5–5.1)
Sodium: 150 mmol/L — ABNORMAL HIGH (ref 135–145)

## 2019-02-26 LAB — POCT I-STAT 7, (LYTES, BLD GAS, ICA,H+H)
Acid-Base Excess: 21 mmol/L — ABNORMAL HIGH (ref 0.0–2.0)
Bicarbonate: 49.6 mmol/L — ABNORMAL HIGH (ref 20.0–28.0)
Calcium, Ion: 1.21 mmol/L (ref 1.15–1.40)
HCT: 37 % — ABNORMAL LOW (ref 39.0–52.0)
Hemoglobin: 12.6 g/dL — ABNORMAL LOW (ref 13.0–17.0)
O2 Saturation: 94 %
Potassium: 3.8 mmol/L (ref 3.5–5.1)
Sodium: 147 mmol/L — ABNORMAL HIGH (ref 135–145)
TCO2: >50 mmol/L — ABNORMAL HIGH (ref 22–32)
pCO2 arterial: 71.9 mmHg (ref 32.0–48.0)
pH, Arterial: 7.447 (ref 7.350–7.450)
pO2, Arterial: 71 mmHg — ABNORMAL LOW (ref 83.0–108.0)

## 2019-02-26 LAB — CBC WITH DIFFERENTIAL/PLATELET
Abs Immature Granulocytes: 0.11 10*3/uL — ABNORMAL HIGH (ref 0.00–0.07)
Basophils Absolute: 0 10*3/uL (ref 0.0–0.1)
Basophils Relative: 0 %
Eosinophils Absolute: 0 10*3/uL (ref 0.0–0.5)
Eosinophils Relative: 0 %
HCT: 40.2 % (ref 39.0–52.0)
Hemoglobin: 12.6 g/dL — ABNORMAL LOW (ref 13.0–17.0)
Immature Granulocytes: 1 %
Lymphocytes Relative: 6 %
Lymphs Abs: 0.6 10*3/uL — ABNORMAL LOW (ref 0.7–4.0)
MCH: 29.6 pg (ref 26.0–34.0)
MCHC: 31.3 g/dL (ref 30.0–36.0)
MCV: 94.4 fL (ref 80.0–100.0)
Monocytes Absolute: 0.4 10*3/uL (ref 0.1–1.0)
Monocytes Relative: 4 %
Neutro Abs: 8.9 10*3/uL — ABNORMAL HIGH (ref 1.7–7.7)
Neutrophils Relative %: 89 %
Platelets: 226 10*3/uL (ref 150–400)
RBC: 4.26 MIL/uL (ref 4.22–5.81)
RDW: 13.4 % (ref 11.5–15.5)
WBC: 10.1 10*3/uL (ref 4.0–10.5)
nRBC: 0 % (ref 0.0–0.2)

## 2019-02-26 LAB — BASIC METABOLIC PANEL WITH GFR
Anion gap: 13 (ref 5–15)
Anion gap: 12 (ref 5–15)
BUN: 73 mg/dL — ABNORMAL HIGH (ref 8–23)
BUN: 63 mg/dL — ABNORMAL HIGH (ref 8–23)
CO2: 42 mmol/L — ABNORMAL HIGH (ref 22–32)
CO2: 42 mmol/L — ABNORMAL HIGH (ref 22–32)
Calcium: 8.5 mg/dL — ABNORMAL LOW (ref 8.9–10.3)
Calcium: 8.5 mg/dL — ABNORMAL LOW (ref 8.9–10.3)
Chloride: 96 mmol/L — ABNORMAL LOW (ref 98–111)
Chloride: 95 mmol/L — ABNORMAL LOW (ref 98–111)
Creatinine, Ser: 0.83 mg/dL (ref 0.61–1.24)
Creatinine, Ser: 0.71 mg/dL (ref 0.61–1.24)
GFR calc Af Amer: >60 mL/min
GFR calc Af Amer: >60 mL/min
GFR calc non Af Amer: >60 mL/min
GFR calc non Af Amer: >60 mL/min
Glucose, Bld: 168 mg/dL — ABNORMAL HIGH (ref 70–99)
Glucose, Bld: 109 mg/dL — ABNORMAL HIGH (ref 70–99)
Potassium: 4 mmol/L (ref 3.5–5.1)
Potassium: 3.8 mmol/L (ref 3.5–5.1)
Sodium: 151 mmol/L — ABNORMAL HIGH (ref 135–145)
Sodium: 149 mmol/L — ABNORMAL HIGH (ref 135–145)

## 2019-02-26 LAB — COMPREHENSIVE METABOLIC PANEL
ALT: 17 U/L (ref 0–44)
AST: 36 U/L (ref 15–41)
Albumin: 2.5 g/dL — ABNORMAL LOW (ref 3.5–5.0)
Alkaline Phosphatase: 69 U/L (ref 38–126)
Anion gap: 12 (ref 5–15)
BUN: 71 mg/dL — ABNORMAL HIGH (ref 8–23)
CO2: 44 mmol/L — ABNORMAL HIGH (ref 22–32)
Calcium: 8.8 mg/dL — ABNORMAL LOW (ref 8.9–10.3)
Chloride: 97 mmol/L — ABNORMAL LOW (ref 98–111)
Creatinine, Ser: 0.83 mg/dL (ref 0.61–1.24)
GFR calc Af Amer: >60 mL/min (ref >60)
GFR calc non Af Amer: >60 mL/min (ref >60)
Glucose, Bld: 243 mg/dL — ABNORMAL HIGH (ref 70–99)
Potassium: 3.8 mmol/L (ref 3.5–5.1)
Sodium: 153 mmol/L — ABNORMAL HIGH (ref 135–145)
Total Bilirubin: 0.4 mg/dL (ref 0.3–1.2)
Total Protein: 5.5 g/dL — ABNORMAL LOW (ref 6.5–8.1)

## 2019-02-26 LAB — GLUCOSE, CAPILLARY
Glucose-Capillary: 192 mg/dL — ABNORMAL HIGH (ref 70–99)
Glucose-Capillary: 198 mg/dL — ABNORMAL HIGH (ref 70–99)
Glucose-Capillary: 163 mg/dL — ABNORMAL HIGH (ref 70–99)
Glucose-Capillary: 110 mg/dL — ABNORMAL HIGH (ref 70–99)
Glucose-Capillary: 133 mg/dL — ABNORMAL HIGH (ref 70–99)

## 2019-02-26 MED ORDER — DEXTROSE 5 % IV SOLN: INTRAVENOUS | Status: DC

## 2019-02-26 MED ORDER — INSULIN DETEMIR 100 UNIT/ML ~~LOC~~ SOLN
14.0000 [IU] | Freq: Every day | SUBCUTANEOUS | Status: DC
  Administered 2019-02-26 – 2019-02-28 (×3): 14 [IU] via SUBCUTANEOUS
  Filled 2019-02-26 (×3): qty 0.14

## 2019-02-26 NOTE — Progress Notes (Signed)
Spoke with primary RN re: PICC order, patient is in prone position at this time and will be back in supine position probably after lunch around 1330-1400. Will follow up.

## 2019-02-26 NOTE — Progress Notes (Signed)
Pt returned to supine position with RT and RN x4. Cloth tape removed and holister placed on pt. ETT remains secure at  25cm. No break down noted to the pts cheeks or lips. RT will continue to monitor.

## 2019-02-26 NOTE — Progress Notes (Signed)
ABG results given to Dr. Lake Bells. Verbal order received to prone pt but to hold off until PICC team can evaluate tomorrow. RT will continue to monitor.

## 2019-02-26 NOTE — Progress Notes (Signed)
NAME:  Andrew Friedman, MRN:  [id number], DOB:  Dec 02, 1930, LOS: 50 ADMISSION DATE:  03/04/2019, CONSULTATION DATE:  12/7 REFERRING MD:  Oval Linsey, CHIEF COMPLAINT:  Dyspnea   Brief History   85 y/o male admitted 12/7 with ARDS due to COVID 19 pneumonia.   Past Medical History  Osteoarthritis Hyperlipidemia BPH Right Herniorrhaphy  Pulmonary Embolism - 2018, large left PE, RV/LV ratio 1.3 suggestive of RH strain, fatty infiltration of liver, s/p 6 months anticoagulation LLL Pulmonary Nodule - seen on 2018 CTA chest, 33mm   Significant Hospital Events   12/07 Admit to [facility] from [facility]  12/08 Convalescent plasma, episodes of brady to 30's 12/09 Episode of bradycardia, polymorphic VT / rhythm disturbances 12/12 Slow AF/Aflutter on monitor, remains on pressors 12/13 Off pressors, HR in 40's / back to SR 12/14 back on low dose pressors. Na rising added free water  Still on high FO2 Consults:    Procedures:  12/7 ETT >  12/7 left subclavian CVL >   Significant Diagnostic Tests:  12/9 Echo> LVEF 65%, RV normal size and function  Micro Data:  COVID 12/7 (OSH) >> positive  BCx2 12/7 >>  Tracheal Aspirate 12/7 >> normal flora, fungus isolated, suspected contaminant ? Urinalysis 12/7 >> negative, mild proteinuria  Quantiferon Gold 12/7 >> negative Acute Hepatitis Panel 12/7 >> negative  BCx2 12/11 >>  Tracheal aspirate 12/11 >> abundant moraxella catarrhalis, beta lactam positive >> UA 12/11 >> many bacteria, WBC 0-5   Antimicrobials:  Redesivir 12/7-12/11 Meropenem 12/11 >>  Vanco 12/11 >> 12/13 Voriconazole 12/11 >>   12/14  Interim history/subjective:  Now prone Peep 12/fio2 100% PIP 28/pplat 24/driving pressure 12 Sedated on fent and versed still on norepi  Objective   Blood pressure 123/64, pulse (Abnormal) 57, temperature 98.3 F (36.8 C), temperature source Axillary, resp. rate (Abnormal) 23, height 5\' 10"  (1.778 m), weight 79.4 kg, SpO2 100 %.     Vent Mode: PRVC FiO2 (%):  [80 %-100 %] 100 % Set Rate:  [25 bmp] 25 bmp Vt Set:  [520 mL] 520 mL PEEP:  [12 cmH20] 12 cmH20 Plateau Pressure:  [25 cmH20-33 cmH20] 31 cmH20   Intake/Output Summary (Last 24 hours) at 02/26/2019 0942 Last data filed at 02/26/2019 0730 Gross per 24 hour  Intake 2346.1 ml  Output 5495 ml  Net -3148.9 ml    Filed Weights   02/21/19 0500 02/23/19 0600 02/26/19 0500  Weight: 75 kg 77 kg 79.4 kg    Examination: General this is a 85 year old male sedated on vent currently in prone position HENT orally intubated.  Pulm: scattered rhonchi w/ exp wheeze bilaterally  Card RRR abd OGT in place Ext no sig edema  Neuro sedated.     Resolved Hospital Problem list     Assessment & Plan:  Acute hypoxic respiratory failure w/ ARDS due to COVID 19 pneumonia pcxr personally reviewed: ett in good position. No sig change in bilateral airspace disease  Plan Cont ARDS protocol vt goal 6-72ml/kg PBW Keep plat < 30 Driving pressure target <15 Cont prone protocol as long as P/F ratio < 150 pao2 goal 55-65; will cont to titrate PEEP/FIO2 Decadron day 9 of 10 Ck CVP daily assessment for lasix. Holding now that on pressors.  PAD protocol: cont fent and versed gtt. RASS goal -3 to -4  HCAP: + M Catarrhalis (Beta lactamase +) and Burkholderia gladioli Plan Cont meropenem day 5/8  Hypotension. Favor sedation related over sepsis at this point.  Wbc  has been trending down Plan Keep euvolemic to negative as tolerated Titrate norepinephrine Ck cvp  Hyperlipidemia Plan Cont fenofibrate   Hyperglycemia Plan Cont ssi and levemir  BPH Plan Cont proscar  Hypernatremia. Little worse in spite of free water added 12/14 Plan Hold lasix today Cont current free water Add D5W at 39ml/hr Repeat chem again at 5p  Best practice:  Diet: tube feeding Pain/Anxiety/Delirium protocol (if indicated): as above VAP protocol (if indicated): yes DVT prophylaxis:  lovenox  GI prophylaxis: famotidine Glucose control: SSI Mobility: SSI Code Status: Limited code blue, DNR if arrests, full medical support otherwise Family Communication: Disposition: remain in ICU  My cct 34 min  Simonne Martinet ACNP-BC [facility] Pulmonary/Critical Care Pager # [phone] OR # [phone] if no answer

## 2019-02-26 NOTE — Progress Notes (Signed)
Per Salvadore Dom NP via secure chat, hold off on PICC placement at this time due to unable to get a PICC consent from family member. Patient has central line.

## 2019-02-27 ENCOUNTER — Inpatient Hospital Stay (HOSPITAL_COMMUNITY): Payer: Medicare Other

## 2019-02-27 DIAGNOSIS — Z515 Encounter for palliative care: Secondary | ICD-10-CM

## 2019-02-27 DIAGNOSIS — Z7189 Other specified counseling: Secondary | ICD-10-CM

## 2019-02-27 DIAGNOSIS — R001 Bradycardia, unspecified: Secondary | ICD-10-CM

## 2019-02-27 LAB — CBC
HCT: 39 % (ref 39.0–52.0)
Hemoglobin: 12.3 g/dL — ABNORMAL LOW (ref 13.0–17.0)
MCH: 29.6 pg (ref 26.0–34.0)
MCHC: 31.5 g/dL (ref 30.0–36.0)
MCV: 94 fL (ref 80.0–100.0)
Platelets: 213 10*3/uL (ref 150–400)
RBC: 4.15 MIL/uL — ABNORMAL LOW (ref 4.22–5.81)
RDW: 13.2 % (ref 11.5–15.5)
WBC: 11.4 10*3/uL — ABNORMAL HIGH (ref 4.0–10.5)
nRBC: 0 % (ref 0.0–0.2)

## 2019-02-27 LAB — BASIC METABOLIC PANEL
Anion gap: 9 (ref 5–15)
Anion gap: 9 (ref 5–15)
BUN: 63 mg/dL — ABNORMAL HIGH (ref 8–23)
BUN: 56 mg/dL — ABNORMAL HIGH (ref 8–23)
CO2: 43 mmol/L — ABNORMAL HIGH (ref 22–32)
CO2: 42 mmol/L — ABNORMAL HIGH (ref 22–32)
Calcium: 8.8 mg/dL — ABNORMAL LOW (ref 8.9–10.3)
Calcium: 8.7 mg/dL — ABNORMAL LOW (ref 8.9–10.3)
Chloride: 92 mmol/L — ABNORMAL LOW (ref 98–111)
Chloride: 94 mmol/L — ABNORMAL LOW (ref 98–111)
Creatinine, Ser: 0.76 mg/dL (ref 0.61–1.24)
Creatinine, Ser: 0.6 mg/dL — ABNORMAL LOW (ref 0.61–1.24)
GFR calc Af Amer: >60 mL/min (ref >60)
GFR calc Af Amer: >60 mL/min (ref >60)
GFR calc non Af Amer: >60 mL/min (ref >60)
GFR calc non Af Amer: >60 mL/min (ref >60)
Glucose, Bld: 212 mg/dL — ABNORMAL HIGH (ref 70–99)
Glucose, Bld: 110 mg/dL — ABNORMAL HIGH (ref 70–99)
Potassium: 4.3 mmol/L (ref 3.5–5.1)
Potassium: 4.2 mmol/L (ref 3.5–5.1)
Sodium: 144 mmol/L (ref 135–145)
Sodium: 145 mmol/L (ref 135–145)

## 2019-02-27 LAB — CULTURE, BLOOD (ROUTINE X 2)
Culture: NO GROWTH
Culture: NO GROWTH
Special Requests: ADEQUATE
Special Requests: ADEQUATE

## 2019-02-27 LAB — GLUCOSE, CAPILLARY
Glucose-Capillary: 117 mg/dL — ABNORMAL HIGH (ref 70–99)
Glucose-Capillary: 130 mg/dL — ABNORMAL HIGH (ref 70–99)
Glucose-Capillary: 162 mg/dL — ABNORMAL HIGH (ref 70–99)
Glucose-Capillary: 163 mg/dL — ABNORMAL HIGH (ref 70–99)
Glucose-Capillary: 199 mg/dL — ABNORMAL HIGH (ref 70–99)
Glucose-Capillary: 205 mg/dL — ABNORMAL HIGH (ref 70–99)

## 2019-02-27 MED ORDER — SODIUM CHLORIDE 0.9% FLUSH
10.0000 mL | Freq: Two times a day (BID) | INTRAVENOUS | Status: DC
  Administered 2019-02-27: 14:00:00 40 mL
  Administered 2019-02-27 – 2019-02-28 (×2): 10 mL
  Administered 2019-02-28: 20 mL
  Administered 2019-03-01: 10 mL

## 2019-02-27 MED ORDER — SODIUM CHLORIDE 0.9% FLUSH: 10.0000 mL | INTRAVENOUS | Status: DC | PRN

## 2019-02-27 NOTE — Progress Notes (Signed)
Peripherally Inserted Central Catheter/Midline Placement  The IV Nurse has discussed with the patient and/or persons authorized to consent for the patient, the purpose of this procedure and the potential benefits and risks involved with this procedure.  The benefits include less needle sticks, lab draws from the catheter, and the patient may be discharged home with the catheter. Risks include, but not limited to, infection, bleeding, blood clot (thrombus formation), and puncture of an artery; nerve damage and irregular heartbeat and possibility to perform a PICC exchange if needed/ordered by physician.  Alternatives to this procedure were also discussed.  Bard Power PICC patient education guide, fact sheet on infection prevention and patient information card has been provided to patient /or left at bedside.    PICC/Midline Placement Documentation  PICC Triple Lumen 60/10/93 PICC Right Basilic 42 cm 0 cm (Active)  Indication for Insertion or Continuance of Line Vasoactive infusions 02/27/19 1303  Exposed Catheter (cm) 0 cm 02/27/19 1303  Site Assessment Clean;Dry;Intact 02/27/19 1303  Lumen #1 Status Flushed;Saline locked;Blood return noted 02/27/19 1303  Lumen #2 Status Flushed;Saline locked;Blood return noted 02/27/19 1303  Lumen #3 Status Flushed;Saline locked;Blood return noted 02/27/19 1303  Dressing Type Transparent 02/27/19 1303  Dressing Status Clean;Dry;Intact;Antimicrobial disc in place 02/27/19 1303  Dressing Change Due 03/06/19 02/27/19 1303       Gordan Payment 02/27/2019, 1:04 PM

## 2019-02-27 NOTE — Progress Notes (Signed)
Assisted tele visit to patient with provider Elmo Putt NP.  Andrew Friedman, Andrew Beams, RN

## 2019-02-27 NOTE — Procedures (Signed)
Cortrak  Person Inserting Tube:  Andrew Friedman, RD Tube Type:  Cortrak - 43 inches Tube Location:  Left nare Initial Placement:  Postpyloric Secured by: Bridle Technique Used to Measure Tube Placement:  Documented cm marking at nare/ corner of mouth Cortrak Secured At:  90 cm    Cortrak Tube Team Note:  Consult received to place a Cortrak feeding tube.   No x-ray is required. RN may begin using tube.  Bridle left loose to aid in proning.   If the tube becomes dislodged please keep the tube and contact the Cortrak team at [email or web address] (password [email or web address]) for replacement.  If after hours and replacement cannot be delayed, place a NG tube and confirm placement with an abdominal x-ray.    [place], [place], [place] Pager [phone] After Hours Pager

## 2019-02-27 NOTE — Consult Note (Signed)
Consultation Note Date: 02/27/2019   Patient Name: Andrew Friedman  DOB: 10/15/1930  MRN: [id number]  Age / Sex: 85 y.o., male  PCP: Lillard Anes, MD Referring Physician: Juanito Doom, MD  Reason for Consultation: Establishing goals of care and Psychosocial/spiritual support  HPI/Patient Profile: 85 y.o. male  with past medical history of PE 2018, LLL pulmonary nodule, HLD, BPH, osteoarthritis admitted on 02/15/2019 with ARDS d/t COVID pneumonia and requiring ventilator support.   Clinical Assessment and Goals of Care: I have reviewed Mr. Forse's records and discussed case with bedside RN. I have also been assisted with tele visit via [place] to better assess Mr. Deringer who is proned and sedated on vent.   I called and spoke with wife, Denzal, via telephone. She is very tearful and anxious during our conversation. I gently explained that to her and that her husband's prognosis is very poor. She tells me that she knows and cries out "Oh God help me and is very tearful. I attempted to calm and reassure her that he is currently sedated and comfortable and appears to be sleeping. He does not appear to be having any pain or distress and we will make sure we continue to ensure his comfort and take good care of him. She is emotionally distressed and unable to continue conversation. She gives me permission to call and share all information with nephew, Ryver Poblete. Emotional support provided. I spoke with her RN and requested chaplain visit for additional support.   I called and spoke with nephew, Randall Hiss, and his wife, Carlis Abbott, via telephone. We were able to discuss Mr. Berrong's care and recommendation and consideration for transition off ventilator in the near future given his poor progress and prognosis. They understand that this conversation is about planning to transition off of ventilator  support and to comfort care. We discussed prognosis as likely hours or less off ventilator support. We discussed visitation guidelines for EOL with COVID patients.   Family is requesting to be allowed to be with Mrs. Compston to discuss extubation to comfort, go to [facility] to visit with Mr. Paulding, and then be present with Mrs. Sharlett Iles for support. Mr. & Mrs. Odriscoll have no children and they are rightfully concerned about Mrs. Gillson's wellbeing facing EOL for her husband of 60+ years. I will make some phone calls and see how we can help to coordinate and support this family in these extraordinary circumstances and times.   All questions/concerns addressed to the best of my ability. Emotional support provided.   Primary Decision Maker NEXT OF KIN wife Denzal Venne    SUMMARY OF RECOMMENDATIONS   - Trying to work out logistics of support to family with plans to likely proceed with comfort care in the upcoming days. Will speak with family again tomorrow.   Code Status/Advance Care Planning:  Limited code - intubation only (I did not discuss this today)   Symptom Management:   Per attending.   Will require morphine infusion to manage respiratory symptoms prior to  extubation if decision to proceed with comfort care is made.   Palliative Prophylaxis:   Aspiration, Bowel Regimen, Delirium Protocol, Frequent Pain Assessment, Oral Care and Turn Reposition  Psycho-social/Spiritual:   Desire for further Chaplaincy support:yes - for family  Additional Recommendations: Grief/Bereavement Support  Prognosis:   Overall prognosis extremely poor with poor progress on full vent support.   Discharge Planning: To Be Determined      Primary Diagnoses: Present on Admission: . Pneumonia due to COVID-19 virus   I have reviewed the medical record, interviewed the patient and family, and examined the patient. The following aspects are pertinent.  Past Medical History:  Diagnosis  Date  . BPH (benign prostatic hyperplasia)   . COVID-19    Positive 12/5  . Hyperlipidemia   . Osteoarthritis   . Pulmonary embolism ([place]) 2018   s/p 6 months anticoagulation  . Pulmonary nodule    LLL 65m   Social History   Socioeconomic History  . Marital status: Married    Spouse name: Not on file  . Number of children: Not on file  . Years of education: Not on file  . Highest education level: Not on file  Occupational History  . Not on file  Tobacco Use  . Smoking status: Never Smoker  . Smokeless tobacco: Never Used  Substance and Sexual Activity  . Alcohol use: Never  . Drug use: Never  . Sexual activity: Not on file  Other Topics Concern  . Not on file  Social History Narrative  . Not on file   Social Determinants of Health   Financial Resource Strain:   . Difficulty of Paying Living Expenses: Not on file  Food Insecurity:   . Worried About RCharity fundraiserin the Last Year: Not on file  . Ran Out of Food in the Last Year: Not on file  Transportation Needs:   . Lack of Transportation (Medical): Not on file  . Lack of Transportation (Non-Medical): Not on file  Physical Activity:   . Days of Exercise per Week: Not on file  . Minutes of Exercise per Session: Not on file  Stress:   . Feeling of Stress : Not on file  Social Connections:   . Frequency of Communication with Friends and Family: Not on file  . Frequency of Social Gatherings with Friends and Family: Not on file  . Attends Religious Services: Not on file  . Active Member of Clubs or Organizations: Not on file  . Attends CArchivistMeetings: Not on file  . Marital Status: Not on file   History reviewed. No pertinent family history. Scheduled Meds: . artificial tears  1 application Both Eyes QS2L . chlorhexidine gluconate (MEDLINE KIT)  15 mL Mouth Rinse BID  . Chlorhexidine Gluconate Cloth  6 each Topical Daily  . dexamethasone (DECADRON) injection  6 mg Intravenous Q24H  .  docusate  100 mg Per Tube BID  . enoxaparin (LOVENOX) injection  40 mg Subcutaneous Q12H  . famotidine  20 mg Per Tube BID  . feeding supplement (PRO-STAT SUGAR FREE 64)  30 mL Per Tube 5 X Daily  . fenofibrate  160 mg Per Tube Daily  . finasteride  5 mg Oral Daily  . folic acid  1 mg Per Tube Daily  . free water  300 mL Per Tube Q3H  . insulin aspart  0-9 Units Subcutaneous Q4H  . insulin detemir  14 Units Subcutaneous QHS  . mouth rinse  15  mL Mouth Rinse 10 times per day  . multivitamin  15 mL Per Tube Daily  . polyethylene glycol  17 g Oral BID  . sodium chloride flush  3 mL Intravenous Q12H  . thiamine  100 mg Per Tube Daily  . vitamin C  500 mg Oral Daily  . zinc sulfate  220 mg Oral Daily   Continuous Infusions: . sodium chloride Stopped (02/25/19 1452)  . dextrose 50 mL/hr at 02/27/19 1035  . feeding supplement (VITAL 1.5 CAL) 40 mL/hr at 02/27/19 1035  . fentaNYL infusion INTRAVENOUS 200 mcg/hr (02/27/19 1055)  . meropenem (MERREM) IV 1 g (02/27/19 0547)  . midazolam 7 mg/hr (02/27/19 1040)  . norepinephrine (LEVOPHED) Adult infusion Stopped (02/27/19 0430)   PRN Meds:.acetaminophen, fentaNYL, midazolam, ondansetron **OR** ondansetron (ZOFRAN) IV, vecuronium Allergies  Allergen Reactions  . Penicillin G Other (See Comments)    Other reaction(s): Other (See Comments) Diarrhea   Review of Systems  Physical Exam  Vital Signs: BP (!) 151/72   Pulse 63   Temp 98.1 F (36.7 C) (Axillary)   Resp 16   Ht '5\' 10"'  (1.778 m)   Wt 82.5 kg   SpO2 93%   BMI 26.10 kg/m  Pain Scale: CPOT POSS *See Group Information*: 2-Acceptable,Slightly drowsy, easily aroused Pain Score: 0-No pain   SpO2: SpO2: 93 % O2 Device:SpO2: 93 % O2 Flow Rate: .   IO: Intake/output summary:   Intake/Output Summary (Last 24 hours) at 02/27/2019 1236 Last data filed at 02/27/2019 1035 Gross per 24 hour  Intake 4421.71 ml  Output 2095 ml  Net 2326.71 ml    LBM: Last BM Date:  02/26/19(FMS) Baseline Weight: Weight: 90.7 kg Most recent weight: Weight: 82.5 kg     Palliative Assessment/Data:     Time In: 1600 Time Out: 1710 Time Total: 70 min Greater than 50%  of this time was spent counseling and coordinating care related to the above assessment and plan.  Signed by: Vinie Sill, NP Palliative Medicine Team Pager # [phone] (M-F 8a-5p) Team Phone # [phone] (Nights/Weekends)   The above conversation was completed via telephone due to the visitor restrictions during the COVID-19 pandemic. Thorough chart review and discussion with necessary members of the care team was completed as part of assessment. All issues were discussed and addressed but no physical exam was performed.

## 2019-02-27 NOTE — Progress Notes (Signed)
Pt holister removed and protective barriers and cloth tape placed. ETT secured at 25cm. Pt proned with RN x3 and RT x2. Pt tolerated well, RT will continue to monitor. No sores to the cheeks or face noted.

## 2019-02-27 NOTE — Progress Notes (Signed)
NAME:  Andrew Friedman, MRN:  [id number], DOB:  18-Feb-1931, LOS: 9 ADMISSION DATE:  02-26-19, CONSULTATION DATE:  12/7 REFERRING MD:  Duke Salvia, CHIEF COMPLAINT:  Dyspnea   Brief History   85 y/o male admitted 12/7 with ARDS due to COVID 19 pneumonia.   Past Medical History  Osteoarthritis Hyperlipidemia BPH Right Herniorrhaphy  Pulmonary Embolism - 2018, large left PE, RV/LV ratio 1.3 suggestive of RH strain, fatty infiltration of liver, s/p 6 months anticoagulation LLL Pulmonary Nodule - seen on 2018 CTA chest, 31mm   Significant Hospital Events   12/07 Admit to [facility] from [facility]  12/08 Convalescent plasma, episodes of brady to 30's 12/09 Episode of bradycardia, polymorphic VT / rhythm disturbances 12/12 Slow AF/Aflutter on monitor, remains on pressors 12/13 Off pressors, HR in 40's / back to SR  Consults:    Procedures:  12/7 ETT >  12/7 left subclavian CVL >   Significant Diagnostic Tests:  12/9 Echo> LVEF 65%, RV normal size and function  Micro Data:  COVID 12/7 (OSH) >> positive  BCx2 12/7 >>  Tracheal Aspirate 12/7 >> normal flora, fungus isolated, suspected contaminant ? Urinalysis 12/7 >> negative, mild proteinuria  Quantiferon Gold 12/7 >> negative Acute Hepatitis Panel 12/7 >> negative  BCx2 12/11 >>  Tracheal aspirate 12/11 >> abundant moraxella catarrhalis, beta lactam positive >> UA 12/11 >> many bacteria, WBC 0-5   Antimicrobials:  Redesivir 12/7-12/11 Meropenem 12/11 >>  Vanco 12/11 >> 12/13 Voriconazole 12/11 >>   12/14  Interim history/subjective:   Not on pressors this morning Remained supine  Objective   Blood pressure 112/62, pulse 69, temperature 98.4 F (36.9 C), temperature source Oral, resp. rate (!) 23, height 5\' 10"  (1.778 m), weight 82.5 kg, SpO2 93 %.    Vent Mode: PRVC FiO2 (%):  [100 %] 100 % Set Rate:  [25 bmp] 25 bmp Vt Set:  [520 mL] 520 mL PEEP:  [12 cmH20] 12 cmH20 Plateau Pressure:  [21 cmH20-30 cmH20] 22  cmH20   Intake/Output Summary (Last 24 hours) at 02/27/2019 02/25/2019 Last data filed at 02/27/2019 0600 Gross per 24 hour  Intake 5063.74 ml  Output 1935 ml  Net 3128.74 ml   Filed Weights   02/23/19 0600 02/26/19 0500 02/27/19 0500  Weight: 77 kg 79.4 kg 82.5 kg    Examination:  General:  In bed on vent HENT: NCAT ETT in place PULM: CTA B, vent supported breathing CV: RRR, no mgr GI: BS+, soft, nontender MSK: normal bulk and tone Neuro: sedated on vent   12/14 CXR images personally reviewed> bilateral airspace disease, ett in place, left effusion  Resolved Hospital Problem list     Assessment & Plan:  ARDS due to COVID 19 pneumonia: no significant improvement in ARDS Continue mechanical ventilation per ARDS protocol Target TVol 6-8cc/kgIBW Target Plateau Pressure < 30cm H20 Target driving pressure less than 15 cm of water Target PaO2 55-65: titrate PEEP/FiO2 per protocol As long as PaO2 to FiO2 ratio is less than 1:150 position in prone position for 16 hours a day Check CVP daily if CVL in place Target CVP less than 4, diurese as necessary Ventilator associated pneumonia prevention protocol 12/16 plan supine after PICC, decadron off after today, lasix  Septic shock due to HCAP (moraxella, burkholderia): improved, off vasopressors Meropenem 7 days Monitor hemodynamics  Need for sedation: RASS target -2 Fentanyl, midazolam infusions per PAD  Hypernatremia Continue free water via tube Lasix  Hyperlipidemia Fenofibrate  Hyperglycemia SSI Levemir  BPH Finasteride    Best practice:  Diet: tube feeding Pain/Anxiety/Delirium protocol (if indicated): as above VAP protocol (if indicated): yes DVT prophylaxis: lovenox  GI prophylaxis: famotidine Glucose control: SSI Mobility: SSI Code Status: Limited code blue, DNR if arrests, full medical support otherwise Family Communication:I spoke to Danzal again today, she [facility]>  I  am requesting that palliative talk to her about limiting our duration of time on the ventilator because his prognosis on vent day 8 with COVID at age 85 is very poor. Disposition: remain in ICU  Labs   CBC: Recent Labs  Lab 02/21/19 0520 02/22/19 0108 02/23/19 0330 02/24/19 [id number] 02/25/19 0526 02/25/19 1221 02/26/19 0540 02/26/19 1629 02/27/19 0447  WBC 8.3 11.9* 19.0* 13.3* 12.6*  --  10.1  --  11.4*  NEUTROABS 7.6 11.4* 18.5*  --   --   --  8.9*  --   --   HGB 13.6 13.8 13.7 12.3* 12.2* 12.6* 12.6* 12.6* 12.3*  HCT 43.4 43.9 43.2 38.9* 38.1* 37.0* 40.2 37.0* 39.0  MCV 94.1 95.2 94.7 94.9 93.2  --  94.4  --  94.0  PLT 434* 368 276 198 207  --  226  --  213    Basic Metabolic Panel: Recent Labs  Lab 02/20/19 1420 02/22/19 0108 02/23/19 0330 02/24/19 0455 02/26/19 0540 02/26/19 1200 02/26/19 1330 02/26/19 1629 02/26/19 1800 02/27/19 0447  NA 147* 144 143  --  153* 150* 151* 147* 149* 144  K 4.7 4.8 5.5*  --  3.8 3.9 4.0 3.8 3.8 4.3  CL 108 101 105   < > 97* 95* 96*  --  95* 92*  CO2 31 31 28    < > 44* 44* 42*  --  42* 43*  GLUCOSE 144* 238* 271*   < > 243* 164* 168*  --  109* 212*  BUN 44* 61* 63*   < > 71* 72* 73*  --  63* 63*  CREATININE 0.67 1.20 1.11   < > 0.83 0.84 0.83  --  0.71 0.76  CALCIUM 8.9 9.0 8.5*   < > 8.8* 8.6* 8.5*  --  8.5* 8.8*  MG 2.2 1.9 2.0  --   --   --   --   --   --   --   PHOS 4.0  --   --   --   --   --   --   --   --   --    < > = values in this interval not displayed.   GFR: Estimated Creatinine Clearance: 69.7 mL/min (by C-G formula based on SCr of 0.76 mg/dL). Recent Labs  Lab 02/24/19 0635 02/25/19 0526 02/26/19 0540 02/27/19 0447  WBC 13.3* 12.6* 10.1 11.4*    Liver Function Tests: Recent Labs  Lab 02/21/19 0520 02/22/19 0108 02/23/19 0330 02/26/19 0540  AST 34 40 36 36  ALT 22 22 20 17   ALKPHOS 48 48 74 69  BILITOT 0.4 0.5 0.7 0.4  PROT 6.5 5.8* 5.6* 5.5*  ALBUMIN 2.8* 2.7* 2.4* 2.5*   No results for input(s):  LIPASE, AMYLASE in the last 168 hours. No results for input(s): AMMONIA in the last 168 hours.  ABG    Component Value Date/Time   PHART 7.447 02/26/2019 1629   PCO2ART 71.9 (HH) 02/26/2019 1629   PO2ART 71.0 (L) 02/26/2019 1629   HCO3 49.6 (H) 02/26/2019 1629   TCO2 >50 (H) 02/26/2019 1629   O2SAT 94.0  02/26/2019 1629     Coagulation Profile: No results for input(s): INR, PROTIME in the last 168 hours.  Cardiac Enzymes: No results for input(s): CKTOTAL, CKMB, CKMBINDEX, TROPONINI in the last 168 hours.  HbA1C: Hgb A1c MFr Bld  Date/Time Value Ref Range Status  2019/02/26 02:50 PM 6.2 (H) 4.8 - 5.6 % Final    Comment:    (NOTE) Pre diabetes:          5.7%-6.4% Diabetes:              >6.4% Glycemic control for   <7.0% adults with diabetes     CBG: Recent Labs  Lab 02/26/19 1153 02/26/19 1552 02/26/19 2048 02/27/19 0028 02/27/19 0406  GLUCAP 163* 110* 133* 162* 205*     Critical care time: 35 minutes     Roselie Awkward, MD [place] [facility] Pager: [phone] Cell: [phone] If no response, call [phone]

## 2019-02-28 ENCOUNTER — Inpatient Hospital Stay (HOSPITAL_COMMUNITY): Payer: Medicare Other

## 2019-02-28 DIAGNOSIS — Z515 Encounter for palliative care: Secondary | ICD-10-CM

## 2019-02-28 DIAGNOSIS — Z7189 Other specified counseling: Secondary | ICD-10-CM

## 2019-02-28 LAB — POCT I-STAT 7, (LYTES, BLD GAS, ICA,H+H)
Acid-Base Excess: 16 mmol/L — ABNORMAL HIGH (ref 0.0–2.0)
Acid-Base Excess: 19 mmol/L — ABNORMAL HIGH (ref 0.0–2.0)
Bicarbonate: 43.8 mmol/L — ABNORMAL HIGH (ref 20.0–28.0)
Bicarbonate: 47.6 mmol/L — ABNORMAL HIGH (ref 20.0–28.0)
Calcium, Ion: 1.28 mmol/L (ref 1.15–1.40)
Calcium, Ion: 1.22 mmol/L (ref 1.15–1.40)
HCT: 35 % — ABNORMAL LOW (ref 39.0–52.0)
HCT: 36 % — ABNORMAL LOW (ref 39.0–52.0)
Hemoglobin: 11.9 g/dL — ABNORMAL LOW (ref 13.0–17.0)
Hemoglobin: 12.2 g/dL — ABNORMAL LOW (ref 13.0–17.0)
O2 Saturation: 82 %
O2 Saturation: 82 %
Potassium: 4.3 mmol/L (ref 3.5–5.1)
Potassium: 3.8 mmol/L (ref 3.5–5.1)
Sodium: 138 mmol/L (ref 135–145)
Sodium: 136 mmol/L (ref 135–145)
TCO2: 46 mmol/L — ABNORMAL HIGH (ref 22–32)
TCO2: 50 mmol/L — ABNORMAL HIGH (ref 22–32)
pCO2 arterial: 72.1 mmHg (ref 32.0–48.0)
pCO2 arterial: 77 mmHg (ref 32.0–48.0)
pH, Arterial: 7.392 (ref 7.350–7.450)
pH, Arterial: 7.399 (ref 7.350–7.450)
pO2, Arterial: 49 mmHg — ABNORMAL LOW (ref 83.0–108.0)
pO2, Arterial: 49 mmHg — ABNORMAL LOW (ref 83.0–108.0)

## 2019-02-28 LAB — BASIC METABOLIC PANEL
Anion gap: 8 (ref 5–15)
BUN: 55 mg/dL — ABNORMAL HIGH (ref 8–23)
CO2: 41 mmol/L — ABNORMAL HIGH (ref 22–32)
Calcium: 8.6 mg/dL — ABNORMAL LOW (ref 8.9–10.3)
Chloride: 92 mmol/L — ABNORMAL LOW (ref 98–111)
Creatinine, Ser: 0.61 mg/dL (ref 0.61–1.24)
GFR calc Af Amer: >60 mL/min (ref >60)
GFR calc non Af Amer: >60 mL/min (ref >60)
Glucose, Bld: 169 mg/dL — ABNORMAL HIGH (ref 70–99)
Potassium: 4.5 mmol/L (ref 3.5–5.1)
Sodium: 141 mmol/L (ref 135–145)

## 2019-02-28 LAB — CBC WITH DIFFERENTIAL/PLATELET
Abs Immature Granulocytes: 0.47 10*3/uL — ABNORMAL HIGH (ref 0.00–0.07)
Basophils Absolute: 0 10*3/uL (ref 0.0–0.1)
Basophils Relative: 0 %
Eosinophils Absolute: 0 10*3/uL (ref 0.0–0.5)
Eosinophils Relative: 0 %
HCT: 35.7 % — ABNORMAL LOW (ref 39.0–52.0)
Hemoglobin: 11.1 g/dL — ABNORMAL LOW (ref 13.0–17.0)
Immature Granulocytes: 5 %
Lymphocytes Relative: 7 %
Lymphs Abs: 0.6 10*3/uL — ABNORMAL LOW (ref 0.7–4.0)
MCH: 29.4 pg (ref 26.0–34.0)
MCHC: 31.1 g/dL (ref 30.0–36.0)
MCV: 94.7 fL (ref 80.0–100.0)
Monocytes Absolute: 0.3 10*3/uL (ref 0.1–1.0)
Monocytes Relative: 4 %
Neutro Abs: 7.7 10*3/uL (ref 1.7–7.7)
Neutrophils Relative %: 84 %
Platelets: 197 10*3/uL (ref 150–400)
RBC: 3.77 MIL/uL — ABNORMAL LOW (ref 4.22–5.81)
RDW: 13.1 % (ref 11.5–15.5)
WBC: 9.2 10*3/uL (ref 4.0–10.5)
nRBC: 0 % (ref 0.0–0.2)

## 2019-02-28 LAB — GLUCOSE, CAPILLARY
Glucose-Capillary: 119 mg/dL — ABNORMAL HIGH (ref 70–99)
Glucose-Capillary: 125 mg/dL — ABNORMAL HIGH (ref 70–99)
Glucose-Capillary: 163 mg/dL — ABNORMAL HIGH (ref 70–99)
Glucose-Capillary: 177 mg/dL — ABNORMAL HIGH (ref 70–99)
Glucose-Capillary: 81 mg/dL (ref 70–99)
Glucose-Capillary: 91 mg/dL (ref 70–99)

## 2019-02-28 LAB — SUSCEPTIBILITY, AER + ANAEROB

## 2019-02-28 LAB — SUSCEPTIBILITY RESULT

## 2019-02-28 MED ORDER — OXYCODONE HCL 5 MG PO TABS
10.0000 mg | ORAL_TABLET | Freq: Four times a day (QID) | ORAL | Status: DC
  Administered 2019-02-28 – 2019-03-01 (×3): 10 mg
  Filled 2019-02-28 (×3): qty 2

## 2019-02-28 MED ORDER — CLONAZEPAM 0.5 MG PO TBDP
2.0000 mg | ORAL_TABLET | Freq: Two times a day (BID) | ORAL | Status: DC
  Administered 2019-02-28 – 2019-03-01 (×3): 2 mg
  Filled 2019-02-28 (×3): qty 4

## 2019-02-28 MED ORDER — FUROSEMIDE 10 MG/ML IJ SOLN
40.0000 mg | Freq: Two times a day (BID) | INTRAMUSCULAR | Status: DC
  Administered 2019-02-28 (×2): 40 mg via INTRAVENOUS
  Filled 2019-02-28 (×2): qty 4

## 2019-02-28 NOTE — Progress Notes (Signed)
Nutrition Follow-up  DOCUMENTATION CODES:   Not applicable  INTERVENTION:   Vital 1.5 @ 40 ml/hr via Cortrak tube Increase Prostat to 60 ml TID  Provides: 2040 kcal, 154 grams protein, and 733 ml free water. Total free water: 3133 ml    NUTRITION DIAGNOSIS:   Increased nutrient needs related to acute illness(COVID-19 related Pneumonia) as evidenced by estimated needs.  Ongoing.   GOAL:   Patient will meet greater than or equal to 90% of their needs  Progressing.   MONITOR:   Vent status, Labs, Weight trends, TF tolerance, Skin, I & O's  REASON FOR ASSESSMENT:   Consult Enteral/tube feeding initiation and management  ASSESSMENT:   Pt with PMH of PE, OA, BPH, HLD admitted from [facility] with severe ARDS from COVID-19 PNA (tested positive 12/5).   Pt on pressors and continues to prone Palliative care consulted, wife hospitalized at [facility].   12/16 Cortrak placed, tip post pyloric   Patient is currently intubated on ventilator support MV: 11.9 L/min Temp (24hrs), Avg:97.7 F (36.5 C), Min:97.5 F (36.4 C), Max:97.8 F (36.6 C)  Medications reviewed and include: colace, folic acid, lasix, levemir, MVI, miralax, thiamine, vitamin C, zinc 300 ml free water every 3 hours = 2400 ml  Levophed @ 1 mcg Labs reviewed   Vital 1.5 @ 40 ml/hr via Cortrak tube 30 ml Prostat five times per day Provides: 1940 kcal, 139 grams protein   Diet Order:   Diet Order            Diet NPO time specified  Diet effective now              EDUCATION NEEDS:   Not appropriate for education at this time  Skin:  Skin Assessment: Reviewed RN Assessment  Last BM:  12/17 660 ml via rectal tube  Height:   Ht Readings from Last 1 Encounters:  02/19/19 5\' 10"  (1.778 m)    Weight:   Wt Readings from Last 1 Encounters:  02/28/19 83.7 kg    Ideal Body Weight:  75.4 kg  BMI:  Body mass index is 26.48 kg/m.  Estimated Nutritional Needs:   Kcal:   2000-2500  Protein:  125-150 grams  Fluid:  2 L/day  Maylon Peppers RD, LDN, CNSC [phone] Pager [phone] After Hours Pager

## 2019-02-28 NOTE — Progress Notes (Addendum)
Per MD rounds - Patient received 40mg  Lasix for diuresis. Ventilator TV 430/ PEEP 12/ FiO2 80%  1515: Patient placed in prone position.   1525: NP Babcock notified patient desaturating, placed back at 100% FiO2, and requiring increased dosages of fentanyl and versed. Notified patient received Vec prior to proning d/t desaturation and vent desynchrony. See provider orders.   Attempt made to contact wife to update. No answer.  Per palliative, after family discussion today, no escalation of care.

## 2019-02-28 NOTE — Progress Notes (Signed)
Pt placed to supine position per MD request. Pt supined with RT x2 and RN x4. Once pt supine cloth tape and protective barriers removed. ETT secure with holister at 25cm No breakdown to the cheeks/lips noted. RT will continue to monitor.

## 2019-02-28 NOTE — Progress Notes (Addendum)
NAME:  Andrew Friedman, MRN:  [id number], DOB:  [birth year], LOS: 10 ADMISSION DATE:  March 06, 2019, CONSULTATION DATE:  12/7 REFERRING MD:  Duke Salvia, CHIEF COMPLAINT:  Dyspnea   Brief History   85 y/o male admitted 12/7 with ARDS due to COVID 19 pneumonia.   Past Medical History  Osteoarthritis Hyperlipidemia BPH Right Herniorrhaphy  Pulmonary Embolism - 2018, large left PE, RV/LV ratio 1.3 suggestive of RH strain, fatty infiltration of liver, s/p 6 months anticoagulation LLL Pulmonary Nodule - seen on 2018 CTA chest, 58mm   Significant Hospital Events   12/07 Admit to [facility]  12/08 Convalescent plasma, episodes of brady to 30's 12/09 Episode of bradycardia, polymorphic VT / rhythm disturbances 12/12 Slow AF/Aflutter on monitor, remains on pressors 12/13 Off pressors, HR in 40's / back to SR 12/17: Reducing tidal volume to 6 cc/kg.  Plateau pressures rising.  Driving pressures at 19. abx stopped  Consults:    Procedures:  12/7 ETT >  12/7 left subclavian CVL >   Significant Diagnostic Tests:  12/9 Echo> LVEF 65%, RV normal size and function  Micro Data:  COVID 12/7 (OSH) >> positive  BCx2 12/7 >>  Tracheal Aspirate 12/7 >> normal flora, fungus isolated, suspected contaminant ? Urinalysis 12/7 >> negative, mild proteinuria  Quantiferon Gold 12/7 >> negative Acute Hepatitis Panel 12/7 >> negative  BCx2 12/11 >>  Tracheal aspirate 12/11 >> abundant moraxella catarrhalis, beta lactam positive >> UA 12/11 >> many bacteria, WBC 0-5   Antimicrobials:  Redesivir 12/7-12/11 Meropenem 12/11 >> 12/17 Vanco 12/11 >> 12/13 Voriconazole 12/11 >>   12/14  Interim history/subjective:   Sedated on ventilator Objective   Blood pressure (Abnormal) 89/57, pulse (Abnormal) 51, temperature 97.7 F (36.5 C), temperature source Oral, resp. rate (Abnormal) 25, height 5\' 10"  (1.778 m), weight 83.7 kg, SpO2 100 %.    Vent Mode: PRVC FiO2 (%):  [100 %] 100 % Set  Rate:  [25 bmp] 25 bmp Vt Set:  [520 mL] 520 mL PEEP:  [12 cmH20] 12 cmH20 Plateau Pressure:  [27 cmH20-29 cmH20] 27 cmH20   Intake/Output Summary (Last 24 hours) at 02/28/2019 0905 Last data filed at 02/28/2019 0800 Gross per 24 hour  Intake 5247.17 ml  Output 2725 ml  Net 2522.17 ml   Filed Weights   02/26/19 0500 02/27/19 0500 02/28/19 0500  Weight: 79.4 kg 82.5 kg 83.7 kg    Examination: General: This is a elderly 85 year old male patient currently sedated on full ventilatory support HEENT normocephalic atraumatic orally intubated no JVD Pulmonary: Scattered rhonchi equal chest rise bilaterally Vent settings: 520,-->430 (6 ml/kg) RR up to 28 from 24; Peep from 12 to 10. Driving pressures from initially 19 down to 14 sats currently 93% ABG on current settings pH 7.39, PCO2 72, PO2 49, HCO3 44, saturations 82% Based on PaO2 we raised PEEP back to 12 Cardiac: Regular rate and rhythm Abdomen nontender positive bowel sounds tolerating tube feeds Extremities warm dry worsening anasarca Neuro heavily sedated GU clear yellow  Resolved Hospital Problem list    Septic shock w/ HCAP Assessment & Plan:  ARDS due to COVID 19 pneumonia: no significant improvement in ARDS Portable chest x-ray personally reviewed: No significant change when comparing prior films Plan Continue ARDS protocol, we have decreased tidal volume from 7 mL/kg predicted body weight down to 6 mL/kg predicted body weight due to plateau pressures greater than 30, now within goal Target plateau pressure less than 30 cmH2O Target driving pressure less  than 15 cmH2O Target PaO2 55-65 Target P to F ratio greater than  150, continuing prone protocol until achieved  Adding back Lasix Sedation protocol Antibiotics per below   Need for sedation: Plan Continuing heavy sedation protocol with RASS goal negative for Currently on high doses of Versed and fentanyl, will add oxycodone and  clonazepam  Hypernatremia Plan Continue free water  Hyperlipidemia Plan  continue fenofibrate  Hyperglycemia Plan Continue sliding scale insulin and Levemir  BPH Plan  Continue finasteride    Best practice:  Diet: tube feeding Pain/Anxiety/Delirium protocol (if indicated): as above VAP protocol (if indicated): yes DVT prophylaxis: lovenox  GI prophylaxis: famotidine Glucose control: SSI Mobility: SSI Code Status: Limited code blue, DNR if arrests, full medical support otherwise Family Communication:I spoke to [place] again today, she has been hospiatlized at [facility] for COVID>  I am requesting that palliative talk to her about limiting our duration of time on the ventilator because his prognosis on vent day 8 with COVID at age 89 is very poor. Disposition: remain in ICU Remains critically ill.  Given his advanced age I am concerned he may not do well.  Currently his wife is now admitted also for Covid.  Seems reasonable to continue supportive care but I think we should probably place limits such as DO NOT RESUSCITATE and no escalation should he develop recurrent organ failure.  I do not think he is a dialysis candidate  Critical care x35 minutes.Erick Colace ACNP-BC [place] Pager # [phone] OR # [phone] if no answer

## 2019-02-28 NOTE — Progress Notes (Signed)
Pt proned at this time per MD order with no complications. VS within normal limits. Ett secured in proper position with cloth tape

## 2019-02-28 NOTE — Progress Notes (Addendum)
Palliative:  HPI: 85 y.o. male  with past medical history of PE 2018, LLL pulmonary nodule, HLD, BPH, osteoarthritis admitted on 02/19/2019 with ARDS d/t COVID pneumonia and requiring ongoing full ventilator support. Overall prognosis extremely poor as he has had no improvement in respiratory status and continues to weaken with prolonged ventilator support.   I have reviewed Nature's records and status. He continues with acute respiratory failure with no real signs of respiratory improvement over hospitalization.   I met today at Andrew Friedman's (wife) bedside in her hospital room. I was joined by Psychologist, clinical, her RN, as well as family via video chat. Family via video chat included nephew and wife, Randall Hiss and Fredric Dine, as well as Zavien's brother and sister-in-law, Jeffie Pollock and Bonnita Nasuti.   We had a discussion regarding Augusto's decline and poor prognosis. Support provided to Andrew Friedman during this difficult discussion. We focused on support from family and her faith to give her strength through this difficult time. Reviewed the strength of her marriage and the wonderful husband and man that Dixie has been his entire life.   We were able to discuss that Ostin is unfortunately not showing the improvement that we have all hoped for. I expressed our fears that he will not improve and is nearing the end of his life. We discussed that we should consider if we are doing right by Fritz Pickerel by continuing life support measures. She is aware and knows that this conversation has been coming. She knows that he is dying and as much as she does not want to let him go she does not want him to suffer. We did briefly discuss one way extubation and that we would ensure that he is comfortable and peaceful throughout this entire process. She confirms that she does not wish to see him in this state before he dies and wants to remember him as he was. She expresses that she wishes to leave the timing and details to nephew, Park Breed encourages her to  help further with these decisions but she is tired and not up to this and wishes for him to help with the details and decision for timing of extubation. I left Andrew Friedman with chaplain Catalina Antigua for further support - very appreciative of his support during this conversation.   I spoke further with Randall Hiss and Fredric Dine and they plan to reach out to family/friends tonight and will have people call or video in via [facility] to tell Enzio goodbye. Once everyone has said their goodbyes we will proceed with one way extubation. I have explained that I anticipate he will not live long (likely minutes to hours) after extubation. In the meantime we will not escalate care to further aggressive/invasive interventions.   All questions/concerns addressed to the best of my ability. Emotional support provided.   Plan: - No escalation to further aggressive/invasive interventions with further decline; DNR but continue intubation for now - Family/friends to call in to say goodbyes over course of tomorrow (bedside RN to assist with coordination of these calls please) - One way compassionate extubation likely for late Friday or Saturday to be coordinated with family (wife Andrew Friedman and nephew/nice Randall Hiss and Danica) - Dr. Rhea Pink to follow up 12/18 with family and provide additional support and guidance  0920-0940, [phone] min  Vinie Sill, NP Palliative Medicine Team Pager [phone] (Please see [email or web address] for schedule) Team Phone [phone]    Greater than 50%  of this time was spent counseling and coordinating care related to the above assessment  and plan   The above conversation was completed via telephone due to the visitor restrictions during the COVID-19 pandemic. Thorough chart review and discussion with necessary members of the care team was completed as part of assessment. All issues were discussed and addressed but no physical exam was performed.

## 2019-02-28 NOTE — Progress Notes (Signed)
Patient turned over to supine positron. Tube secured at 25cm at the lip. Patient remained stable during procedure.

## 2019-03-01 LAB — GLUCOSE, CAPILLARY
Glucose-Capillary: 101 mg/dL — ABNORMAL HIGH (ref 70–99)
Glucose-Capillary: 104 mg/dL — ABNORMAL HIGH (ref 70–99)
Glucose-Capillary: 135 mg/dL — ABNORMAL HIGH (ref 70–99)

## 2019-03-01 MED ORDER — NOREPINEPHRINE 4 MG/250ML-% IV SOLN
7.0000 ug/min | INTRAVENOUS | Status: DC
  Administered 2019-03-01: 7 ug/min via INTRAVENOUS

## 2019-03-05 LAB — CULTURE, RESPIRATORY W GRAM STAIN

## 2019-03-15 NOTE — Plan of Care (Signed)
Pt sedated and intubated, unable to update pt on POC 

## 2019-03-15 NOTE — Progress Notes (Signed)
Spoke w/ Andrew Friedman.  They have requested that we also call Andrew Friedman's brother Mr Andrew Friedman.   [phone]

## 2019-03-15 NOTE — Progress Notes (Signed)
I spoke w/ Sandrea Hughs (pt's wife via phone). Updated her on current condition. Confirmed the plan was to proceed w/ compassionate extubation once all family members had a chance to say good bye via phone.   Nursing staff completed that list.  At the completion of the phone calls we will remove the ventilator per my agreement w/ Mrs Tetrick.  Erick Colace ACNP-BC [place] Pager # [phone] OR # [phone] if no answer

## 2019-03-15 NOTE — Progress Notes (Signed)
Chaplain provided support with pt's spouse via ipad.  Pt's spouse is also COIVD + and in [place].    Provided pastoral support and prayers with pt and spouse, narrative review - identifying values that inform her care for him, and anticipatory grief work.      Spouse understands that she will need to make decision around pt's extubation.  She wished her nephew, Andrew Friedman, to take on this role, and Andrew Friedman has maintained that he wishes to support her in this, but is unable to be the one who makes the decision.  Andrew Friedman does not want Wilfrido to suffer and recognizes that the time for extubation is approaching.   Andrew Friedman does not want to be alone when she makes this decision.

## 2019-03-15 NOTE — Progress Notes (Signed)
Called NP Collier Salina that all phone calls that have been requested by Antionette Fairy have been completed.  NP stated that he will call Randall Hiss and give further orders soon. 2019-03-15 Regan, RN

## 2019-03-15 NOTE — Progress Notes (Signed)
NAME:  Andrew Friedman, MRN:  [id number], DOB:  [birth year], LOS: 26 ADMISSION DATE:  03/11/2019, CONSULTATION DATE:  12/7 REFERRING MD:  Oval Linsey, CHIEF COMPLAINT:  Dyspnea   Brief History   86 y/o male admitted 12/7 with ARDS due to COVID 19 pneumonia.   Past Medical History  Osteoarthritis Hyperlipidemia BPH Right Herniorrhaphy  Pulmonary Embolism - 2018, large left PE, RV/LV ratio 1.3 suggestive of RH strain, fatty infiltration of liver, s/p 6 months anticoagulation LLL Pulmonary Nodule - seen on 2018 CTA chest, 73m   Significant Hospital Events   [facility] Convalescent plasma, episodes of brady to 30's 12/09 Episode of bradycardia, polymorphic VT / rhythm disturbances 12/12 Slow AF/Aflutter on monitor, remains on pressors 12/13 Off pressors, HR in 40's / back to SR 12/17: Reducing tidal volume to 6 cc/kg.  Plateau pressures rising.  Driving pressures at 19. abx stopped palliative care met with family decision was made for compassionate extubation either the 18th or 19th 12/18: All therapies and lab work discontinued that do not directly treat comfort or dignity Consults:    Procedures:  12/7 ETT >  12/7 left subclavian CVL >   Significant Diagnostic Tests:  12/9 Echo> LVEF 65%, RV normal size and function  Micro Data:  COVID 12/7 (OSH) >> positive  BCx2 12/7 >>  Tracheal Aspirate 12/7 >> normal flora, fungus isolated, suspected contaminant ? Urinalysis 12/7 >> negative, mild proteinuria  Quantiferon Gold 12/7 >> negative Acute Hepatitis Panel 12/7 >> negative  BCx2 12/11 >>  Tracheal aspirate 12/11 >> abundant moraxella catarrhalis, beta lactam positive >> UA 12/11 >> many bacteria, WBC 0-5   Antimicrobials:  Redesivir 12/7-12/11 Meropenem 12/11 >> 12/17 Vanco 12/11 >> 12/13 Voriconazole 12/11 >>   12/14  Interim history/subjective:  Remains sedated on ventilator, hypotensive requiring ongoing pressors, still on high  PEEP and FiO2 Objective   Blood pressure (Abnormal) 106/49, pulse 80, temperature 98 F (36.7 C), temperature source Axillary, resp. rate (Abnormal) 22, height '5\' 10"'  (1.778 m), weight 81.1 kg, SpO2 (Abnormal) 87 %.    Vent Mode: PRVC FiO2 (%):  [80 %-100 %] 100 % Set Rate:  [28 bmp] 28 bmp Vt Set:  [430 mL] 430 mL PEEP:  [10 cJXB14-78cmH20] 12 cmH20 Plateau Pressure:  [26 cmH20-30 cmH20] 30 cmH20   Intake/Output Summary (Last 24 hours) at 1December 19, [id number] data filed at [id number] Gross per 24 hour  Intake 4240.69 ml  Output 4760 ml  Net -519.31 ml   Filed Weights   02/27/19 0500 02/28/19 0500 [id number]  Weight: 82.5 kg 83.7 kg 81.1 kg    Examination: General this is an 86year old white male currently in the supine position he is exhibiting some ventilator asynchrony this morning with saturations as low as 70 Pulmonary coarse scattered rales equal chest rise Current ventilator settings: Tidal volume 430 respiratory rate 28 PEEP 12 FiO2 100% saturations 94% on this Current P plat: Unable to calculate due to ventilator dyssynchrony Cardiac regular rate and rhythm  Abdomen soft bowel sounds present Extremities cool, worsening anasarca pulses palpable Neuro heavily sedated  Resolved Hospital Problem list    Septic shock w/ HCAP Assessment & Plan:  ARDS due to COVID 19 pneumonia: no significant improvement in ARDS Need for sedation: Hypernatremia Hyperlipidemia Hyperglycemia BPH  Discussion Palliative care met with Mr. PCapekfamily on 12/17.  This included his wife who is in the hospital as well as family members.  Decision  was made for compassionate extubation either later today or tomorrow on Saturday the 19th.  Based on this we will continue current interventions but discontinue further lab work or escalation  Plan Continuing low tidal volume ventilation at current settings Discontinue proning Continue heavy sedation with comfort as primary  goal VAP bundle Discontinuing all medications that do not contribute to comfort and dignity Discontinue lab work No further x-rays   Best practice:  Diet: tube feeding Pain/Anxiety/Delirium protocol (if indicated): as above VAP protocol (if indicated): yes DVT prophylaxis: lovenox  GI prophylaxis: famotidine Glucose control: SSI Mobility: SSI Code Status: DO NOT RESUSCITATE  Disposition plan is for compassionate extubation after family has a opportunity to say goodbye  My critical care x32 minutes  Erick Colace ACNP-BC [place] Pager # [phone] OR # [phone] if no answer

## 2019-03-15 NOTE — Death Summary Note (Signed)
DEATH SUMMARY   Patient Details  Name: Andrew Friedman MRN: [id number] DOB: Dec 06, 1930  Admission/Discharge Information   Admit Date:  March 19, 2019  Date of Death: Date of Death: 2019-03-30  Time of Death: Time of Death: Jul 25, 1718  Length of Stay: 07-22-22  Referring Physician: Lillard Anes, MD   Reason(s) for Hospitalization  Dyspnea  Diagnoses  Preliminary cause of death:   COVID 07-30-22 Secondary Diagnoses (including complications and co-morbidities):  Active Problems:   Acute respiratory distress syndrome (ARDS) due to COVID-19 virus (HCC)   Acute respiratory failure with hypoxia (HCC)   Bradycardia   Goals of care, counseling/discussion   Palliative care encounter   Brief Hospital Course (including significant findings, care, treatment, and services provided and events leading to death)  Andrew Friedman is a 86 y.o. year old male who was admitted on 03-19-23 with acute hypoxemic respiratory failure, ARDS due to COVID 19 pneumonia.   He was intubated at [facility] and transferred to [facility] for further management.  He was given convalescent plasma, remdesivir, and treated with broad spectrum antibiotics.  He developed septic shock and gram negative rod pneumonia (moraxella catarrhalis and Burkholderia) and was treated for the same.  The shock improved but his lung function did not.  Given his advanced age and lack of improvement over 10 days on the ventilator we discussed goals of care with family with the help of palliative medicine.  The family elected to withdraw care considering his overall very poor prognosis.       Pertinent Labs and Studies  Significant Diagnostic Studies DG Chest Port 1 View  Result Date: 02/28/2019 CLINICAL DATA:  Acute respiratory failure with hypoxemia. EXAM: PORTABLE CHEST 1 VIEW COMPARISON:  February 27, 2019. FINDINGS: Endotracheal and feeding tube are in grossly good position. Interval placement of right-sided PICC line with distal tip in expected  position of cavoatrial junction. Atherosclerosis of thoracic aorta is noted. Stable cardiomediastinal silhouette. No pneumothorax or pleural effusion is noted. Stable bibasilar opacities are noted concerning for pneumonia. Bony thorax is unremarkable. IMPRESSION: Support apparatus in good position. Stable bibasilar opacities are noted concerning for pneumonia. Aortic atherosclerosis. Interval placement of right-sided PICC line with distal tip in expected position of cavoatrial junction. Electronically Signed   By: Marijo Conception M.D.   On: 02/28/2019 08:20   DG Chest Port 1 View  Result Date: 02/27/2019 CLINICAL DATA:  Acute respiratory failure with hypoxia. EXAM: PORTABLE CHEST 1 VIEW COMPARISON:  February 26, 2019. FINDINGS: The heart size and mediastinal contours are within normal limits. Endotracheal and nasogastric tubes are unchanged in position. No pneumothorax is noted. Stable bilateral lung opacities are noted concerning for pneumonia or edema. The visualized skeletal structures are unremarkable. IMPRESSION: Stable support apparatus. Stable bilateral lung opacities are noted concerning for pneumonia or edema. Electronically Signed   By: Marijo Conception M.D.   On: 02/27/2019 09:34   DG Chest Port 1 View  Result Date: 02/26/2019 CLINICAL DATA:  Acute respiratory failure with hypoxemia EXAM: PORTABLE CHEST 1 VIEW COMPARISON:  February 25, 2019 FINDINGS: Endotracheal tube, left subclavian line, and enteric tube are again identified. Persistent bilateral opacities, greatest at the lung bases. Lung aeration is overall slightly improved. Stable cardiomediastinal contours. No pneumothorax. IMPRESSION: Stable lines and tubes. Slight improvement in lung aeration with persistent bilateral opacities. Electronically Signed   By: Macy Mis M.D.   On: 02/26/2019 08:08   DG Chest Port 1 View  Result Date: 02/25/2019 CLINICAL DATA:  Acute  respiratory failure with hypoxia.  COVID-19 EXAM: PORTABLE CHEST  1 VIEW COMPARISON:  Most recent radiograph yesterday. FINDINGS: Endotracheal tube tip at the thoracic inlet. Enteric tube in place with tip below the diaphragm not included in the field of view. Left subclavian central line tip in the mid SVC. Unchanged heart size and mediastinal contours. No significant change in the diffuse bilateral heterogeneous lung opacities. No pneumothorax. IMPRESSION: 1. Unchanged diffuse bilateral lung opacities consistent with COVID-19 pneumonia. 2. Stable support apparatus. Electronically Signed   By: Narda Rutherford M.D.   On: 02/25/2019 05:07   DG Chest Port 1 View  Result Date: 02/24/2019 CLINICAL DATA:  Acute respiratory failure with hypoxia. EXAM: PORTABLE CHEST 1 VIEW COMPARISON:  February 22, 2019. FINDINGS: Stable cardiomediastinal silhouette. Endotracheal and nasogastric tubes are unchanged in position. Stable left subclavian catheter is noted. Mildly increased bibasilar opacities are noted concerning for worsening pneumonia. Bony thorax is unremarkable. IMPRESSION: Stable support apparatus. Mildly increased bibasilar opacities are noted concerning for worsening pneumonia. Continued radiographic follow-up is recommended to ensure resolution. Electronically Signed   By: Lupita Raider M.D.   On: 02/24/2019 08:49   DG Chest Port 1 View  Result Date: 02/22/2019 CLINICAL DATA:  86 year old male with history of COVID-19 infection. Evaluate for pneumothorax. EXAM: PORTABLE CHEST 1 VIEW COMPARISON:  Chest x-ray 02/22/2019. FINDINGS: An endotracheal tube is in place with tip 6.0 cm above the carina. Nasogastric tube extends into the mid stomach with side port below the level of the gastroesophageal junction. There is a left-sided subclavian central venous catheter with tip terminating in the mid superior vena cava. Patchy multifocal interstitial opacities and airspace disease scattered throughout the mid to lower lungs bilaterally, similar to the recent prior study. No  definite pneumothorax. No pleural effusions. No evidence of pulmonary edema. Heart size is normal. Upper mediastinal contours are within normal limits. Aortic atherosclerosis. IMPRESSION: 1. Support apparatus, as above. 2. No pneumothorax. 3. Multilobar bilateral pneumonia, similar to the prior study. Electronically Signed   By: Trudie Reed M.D.   On: 02/22/2019 10:15   DG Chest Port 1 View  Result Date: 02/22/2019 CLINICAL DATA:  Pneumothorax. Comments notes state: Once patient is transition from prone to supine check position of endotracheal tube. EXAM: PORTABLE CHEST 1 VIEW COMPARISON:  Radiograph yesterday. FINDINGS: Endotracheal tube tip 3.1 cm from the carina. Enteric tube tip and side-port below the diaphragm in the stomach. Left subclavian central line tip in the SVC. Slight worsening in heterogeneous bilateral airspace opacities. Unchanged heart size and mediastinal contours. No visualized pneumothorax. No significant pleural fluid. IMPRESSION: 1. Endotracheal tube tip 3.1 cm from the carina. Support apparatus otherwise unchanged 2. Slight worsening in heterogeneous airspace opacities bilaterally. No pneumothorax. Electronically Signed   By: Narda Rutherford M.D.   On: 02/22/2019 06:10   DG CHEST PORT 1 VIEW  Result Date: 02/21/2019 CLINICAL DATA:  Acute respiratory failure with hypoxia. EXAM: PORTABLE CHEST 1 VIEW COMPARISON:  Earlier film, same date. FINDINGS: The endotracheal tube is 8 cm above the carina. The left subclavian central venous catheter tip is in the distal SVC. The NG tube is coursing down the esophagus and into the stomach. The cardiac silhouette, mediastinal and hilar contours are within normal limits and stable. Persistent bilateral interstitial and airspace process in the lungs. No definite pleural effusions. No pneumothorax. IMPRESSION: 1. Stable support apparatus. 2. Persistent diffuse patchy interstitial and airspace process in the lungs. Electronically Signed   By: Orlene Plum.D.  On: 02/21/2019 12:45   DG Chest Port 1 View  Result Date: 02/21/2019 CLINICAL DATA:  Acute respiratory failure/hypoxia. EXAM: PORTABLE CHEST 1 VIEW COMPARISON:  02/19/2019 FINDINGS: Left subclavian central venous catheter unchanged. Enteric tube courses into the stomach and off the film as tip is not visualized. Endotracheal tube has tip approximately 7.8 cm above the carina. Lungs are adequately inflated demonstrate interval improvement in bilateral airspace process with mild residual hazy airspace attenuation over the mid to lower lungs left worse than right. No effusion. Cardiomediastinal silhouette and remainder of the exam is unchanged. IMPRESSION: Moderate interval improvement and mild hazy bilateral airspace process likely improving edema or infection. Tubes and lines as described. Electronically Signed   By: Elberta Fortis M.D.   On: 02/21/2019 07:50   DG Chest Port 1 View  Result Date: 02/19/2019 CLINICAL DATA:  Acute respiratory failure with hypoxemia. COVID-19. EXAM: PORTABLE CHEST 1 VIEW COMPARISON:  02/24/19 and 02/17/2019 FINDINGS: There has been marked progression of the extensive bilateral pulmonary infiltrates since the study of February 24, 2019. Endotracheal tube, NG tube and central venous catheter are in good position. No pneumothorax. No pleural effusions. Heart size and vascularity are normal. Aortic atherosclerosis. No acute bone abnormality. IMPRESSION: 1. Marked progression of the extensive bilateral pulmonary infiltrates since the study of 02-24-2019. 2.  Aortic Atherosclerosis (ICD10-I70.0). Electronically Signed   By: Francene Boyers M.D.   On: 02/19/2019 08:26   DG CHEST PORT 1 VIEW  Result Date: 02-24-19 CLINICAL DATA:  Central line placement, COVID-19 EXAM: PORTABLE CHEST 1 VIEW COMPARISON:  Portable exam 1724 hours compared to 1450 hours FINDINGS: Tip of endotracheal tube projects 3.4 cm above carina. Nasogastric tube extends into stomach. LEFT subclavian  central venous catheter with tip projecting over SVC. Stable heart size mediastinal contours. Atherosclerotic calcification aorta. BILATERAL pulmonary infiltrates consistent with multifocal pneumonia. No pleural effusion or pneumothorax. IMPRESSION: No pneumothorax following LEFT subclavian line placement. BILATERAL pulmonary infiltrates consistent with multifocal pneumonia. Electronically Signed   By: Ulyses Southward M.D.   On: 2019-02-24 17:42   DG CHEST PORT 1 VIEW  Result Date: 2019-02-24 CLINICAL DATA:  Acute respiratory failure with hypoxia EXAM: PORTABLE CHEST 1 VIEW COMPARISON:  Earlier same day FINDINGS: Enteric tube is approximately 4 cm from carina. Enteric tube passes into the stomach. Bilateral opacities are again identified with overall mild improvement in aeration. No significant pleural effusion. No pneumothorax. Stable cardiomediastinal contours. IMPRESSION: Mild improvement in bilateral pulmonary opacities. Stable lines and tubes. Electronically Signed   By: Guadlupe Spanish M.D.   On: February 24, 2019 15:09   ECHOCARDIOGRAM COMPLETE  Result Date: 02/20/2019   ECHOCARDIOGRAM REPORT   Patient Name:   Andrew Friedman Date of Exam: 02/20/2019 Medical Rec #:  [id number]         Height:       70.0 in Accession #:    [id number]        Weight:       166.4 lb Date of Birth:  1930/11/09        BSA:          1.93 m Patient Age:    87 years          BP:           129/68 mmHg Patient Gender: M                 HR:           47 bpm. Exam Location:  Inpatient Procedure: 2D Echo,  Cardiac Doppler and Color Doppler                            MODIFIED REPORT: This report was modified by Lennie Odor MD on 02/20/2019 due to Error.  Indications:     Acute respiratory failure.  History:         Patient has no prior history of Echocardiogram examinations.                  Risk Factors:Dyslipidemia. Pulmonary embolus. Covid 19                  positive.  Sonographer:     Sheralyn Boatman RDCS Referring Phys:  [id number] Jeanella Craze  Diagnosing Phys: Lennie Odor MD  Sonographer Comments: Echo performed with patient supine and on artificial respirator. IMPRESSIONS  1. Left ventricular ejection fraction, by visual estimation, is 65 to 70%. The left ventricle has normal function. There is no left ventricular hypertrophy.  2. The left ventricle has no regional wall motion abnormalities.  3. Global right ventricle has normal systolic function.The right ventricular size is normal. No increase in right ventricular wall thickness.  4. Left atrial size was normal.  5. Right atrial size was normal.  6. Mild mitral annular calcification.  7. The mitral valve is degenerative. Mild mitral valve regurgitation.  8. The tricuspid valve is grossly normal. Tricuspid valve regurgitation is mild.  9. The aortic valve was not well visualized. Aortic valve regurgitation is mild. Mild aortic valve stenosis. 10. The pulmonic valve was grossly normal. Pulmonic valve regurgitation is not visualized. 11. Normal pulmonary artery systolic pressure. 12. The tricuspid regurgitant velocity is 2.50 m/s, and with an assumed right atrial pressure of 8 mmHg, the estimated right ventricular systolic pressure is normal at 33.1 mmHg. 13. The inferior vena cava is normal in size with <50% respiratory variability, suggesting right atrial pressure of 8 mmHg. 14. No prior Echocardiogram. FINDINGS  Left Ventricle: Left ventricular ejection fraction, by visual estimation, is 65 to 70%. The left ventricle has normal function. The left ventricle has no regional wall motion abnormalities. The left ventricular internal cavity size was the left ventricle is normal in size. There is no left ventricular hypertrophy. Left ventricular diastolic parameters are consistent with age-related delayed relaxation (normal). Normal left atrial pressure. Right Ventricle: The right ventricular size is normal. No increase in right ventricular wall thickness. Global RV systolic function is has normal systolic  function. The tricuspid regurgitant velocity is 2.50 m/s, and with an assumed right atrial pressure  of 8 mmHg, the estimated right ventricular systolic pressure is normal at 33.1 mmHg. Left Atrium: Left atrial size was normal in size. Right Atrium: Right atrial size was normal in size Pericardium: There is no evidence of pericardial effusion. Mitral Valve: The mitral valve is degenerative in appearance. There is mild thickening of the mitral valve leaflet(s). There is mild calcification of the mitral valve leaflet(s). Mild mitral annular calcification. Mild mitral valve regurgitation. Tricuspid Valve: The tricuspid valve is grossly normal. Tricuspid valve regurgitation is mild. Aortic Valve: The aortic valve was not well visualized. Aortic valve regurgitation is mild. Mild aortic stenosis is present. Aortic valve mean gradient measures 12.3 mmHg. Aortic valve peak gradient measures 26.8 mmHg. Aortic valve area, by VTI measures 1.53 cm. Pulmonic Valve: The pulmonic valve was grossly normal. Pulmonic valve regurgitation is not visualized. Pulmonic regurgitation is not visualized. Aorta: The aortic root is normal  in size and structure. Venous: The inferior vena cava is normal in size with less than 50% respiratory variability, suggesting right atrial pressure of 8 mmHg. IAS/Shunts: No atrial level shunt detected by color flow Doppler.  LEFT VENTRICLE PLAX 2D LVIDd:         3.73 cm       Diastology LVIDs:         2.44 cm       LV e' lateral:   6.09 cm/s LV PW:         1.26 cm       LV E/e' lateral: 13.0 LV IVS:        1.06 cm       LV e' medial:    6.64 cm/s LVOT diam:     1.90 cm       LV E/e' medial:  11.9 LV SV:         38 ml LV SV Index:   19.76 LVOT Area:     2.84 cm  LV Volumes (MOD) LV area d, A2C:    12.70 cm LV area d, A4C:    19.70 cm LV area s, A2C:    6.69 cm LV area s, A4C:    10.40 cm LV major d, A2C:   5.04 cm LV major d, A4C:   6.24 cm LV major s, A2C:   4.50 cm LV major s, A4C:   5.30 cm LV vol  d, MOD A2C: 27.0 ml LV vol d, MOD A4C: 50.2 ml LV vol s, MOD A2C: 8.6 ml LV vol s, MOD A4C: 18.2 ml LV SV MOD A2C:     18.4 ml LV SV MOD A4C:     50.2 ml LV SV MOD BP:      27.1 ml RIGHT VENTRICLE             IVC RV S prime:     12.70 cm/s  IVC diam: 2.54 cm TAPSE (M-mode): 1.8 cm LEFT ATRIUM             Index       RIGHT ATRIUM           Index LA diam:        2.50 cm 1.29 cm/m  RA Area:     12.70 cm LA Vol (A2C):   31.6 ml 16.37 ml/m RA Volume:   30.00 ml  15.54 ml/m LA Vol (A4C):   20.8 ml 10.77 ml/m LA Biplane Vol: 26.4 ml 13.67 ml/m  AORTIC VALVE AV Area (Vmax):    1.25 cm AV Area (Vmean):   1.28 cm AV Area (VTI):     1.53 cm AV Vmax:           258.67 cm/s AV Vmean:          160.000 cm/s AV VTI:            0.568 m AV Peak Grad:      26.8 mmHg AV Mean Grad:      12.3 mmHg LVOT Vmax:         114.00 cm/s LVOT Vmean:        72.500 cm/s LVOT VTI:          0.306 m LVOT/AV VTI ratio: 0.54  AORTA Ao Root diam: 3.40 cm Ao Asc diam:  3.50 cm MITRAL VALVE                        TRICUSPID VALVE MV  Area (PHT): 2.22 cm             TR Peak grad:   25.1 mmHg MV PHT:        99.18 msec           TR Vmax:        258.00 cm/s MV Decel Time: 342 msec MV E velocity: 79.10 cm/s 103 cm/s  SHUNTS MV A velocity: 90.70 cm/s 70.3 cm/s Systemic VTI:  0.31 m MV E/A ratio:  0.87       1.5       Systemic Diam: 1.90 cm  Lennie Odor MD Electronically signed by Lennie Odor MD Signature Date/Time: 02/20/2019/3:34:48 PM    Final (Updated)    [place] EKG SITE RITE  Result Date: 02/26/2019 If Site Rite image not attached, placement could not be confirmed due to current cardiac rhythm.   Microbiology Recent Results (from the past 240 hour(s))  Culture, blood (Routine X 2) w Reflex to ID Panel     Status: None   Collection Time: 02/19/19  9:40 PM   Specimen: BLOOD  Result Value Ref Range Status   Specimen Description   Final    BLOOD BLOOD LEFT HAND Performed at [facility], [address] [place],  [place] [address]    Special Requests   Final    BOTTLES DRAWN AEROBIC ONLY Blood Culture adequate volume Performed at [facility], [address] [place], [place] [address]    Culture   Final    NO GROWTH 5 DAYS Performed at [facility], [address], [place], [place] [address]    Report Status 02/25/2019 FINAL  Final  Culture, blood (Routine X 2) w Reflex to ID Panel     Status: None   Collection Time: 02/19/19  9:40 PM   Specimen: BLOOD  Result Value Ref Range Status   Specimen Description   Final    BLOOD LEFT ANTECUBITAL Performed at [facility], [address] [place], [place] [address]    Special Requests   Final    BOTTLES DRAWN AEROBIC ONLY Blood Culture adequate volume Performed at [facility], [address] [place], [place] [address]    Culture   Final    NO GROWTH 5 DAYS Performed at [facility], [address] [place], [place] [address]    Report Status 02/25/2019 FINAL  Final  Culture, respiratory (non-expectorated)     Status: None (Preliminary result)   Collection Time: 02/22/19  9:28 AM   Specimen: Tracheal Aspirate; Respiratory  Result Value Ref Range Status   Specimen Description   Final    TRACHEAL ASPIRATE Performed at [facility], [address] [place], [place] [address]    Special Requests   Final    NONE Performed at [facility], [address], [place], [place] [address]    Gram Stain   Final    RARE WBC PRESENT,BOTH PMN AND MONONUCLEAR ABUNDANT GRAM POSITIVE COCCI FEW GRAM NEGATIVE RODS    Culture   Final    ABUNDANT MORAXELLA CATARRHALIS(BRANHAMELLA) BETA LACTAMASE POSITIVE FEW BURKHOLDERIA SPECIES Sent to [facility] for further susceptibility testing. FOR SECOND ORGANISM Performed at [facility], [address] [place], [place] [address]    Report Status PENDING  Incomplete  Susceptibility, Aer + Anaerob     Status:  Abnormal   Collection Time: 02/22/19  9:28 AM  Result Value Ref  Range Status   Suscept, Aer + Anaerob Final report (A)  Corrected    Comment: (NOTE) Performed At: [facility] [address] [place], [place] [id number] Jolene Schimke MD WU:[id number] CORRECTED ON 12/17 AT 0538: PREVIOUSLY REPORTED AS Preliminary report    Source of Sample TRACHEAL ASPIRATE  Final    Comment: BURKHOLDERIA GLADIOLI Performed at [facility], [address] [place], [place] [address]   Susceptibility Result     Status: Abnormal   Collection Time: 02/22/19  9:28 AM  Result Value Ref Range Status   Suscept Result 1 Burkholderia gladioli (A)  Final    Comment: (NOTE) Identification performed by account, not confirmed by this laboratory.    Antimicrobial Suscept Comment  Corrected    Comment: (NOTE)      ** S = Susceptible; I = Intermediate; R = Resistant **                   P = Positive; N = Negative            MICS are expressed in micrograms per mL   Antibiotic                 RSLT#1    RSLT#2    RSLT#3    RSLT#4 Amikacin                       S Aztreonam                      R Cefepime                       I Cefotaxime                     I Ceftazidime                    R Ceftriaxone                    S Ciprofloxacin                  S Gentamicin                     S Imipenem                       S Levofloxacin                   S Meropenem                      S Piperacillin/Tazobactam        S Ticarcillin/Clavulanate        S Tobramycin                     S Trimethoprim/Sulfa             S Performed At: [facility] [address] [place], [place] [id number] Jolene Schimke MD NG:[id number]   Culture, blood (routine x 2)     Status: None   Collection Time: 02/22/19 10:35 AM   Specimen: BLOOD  Result Value Ref Range Status   Specimen Description   Final    BLOOD LEFT HAND Performed at [facility], [address], [place], [place]  [address]    Special Requests   Final  BOTTLES DRAWN AEROBIC ONLY Blood Culture adequate volume Performed at [facility], [address] [place], [place] [address]    Culture   Final    NO GROWTH 5 DAYS Performed at [facility], [address] [place], [place] [address]    Report Status 02/27/2019 FINAL  Final  Culture, blood (routine x 2)     Status: None   Collection Time: 02/22/19 10:40 AM   Specimen: BLOOD  Result Value Ref Range Status   Specimen Description   Final    BLOOD RIGHT HAND Performed at [facility], [address] [place], [place] [address]    Special Requests   Final    BOTTLES DRAWN AEROBIC ONLY Blood Culture adequate volume Performed at [facility], [address] [place], [place] [address]    Culture   Final    NO GROWTH 5 DAYS Performed at [facility], [address] [place], [place] [address]    Report Status 02/27/2019 FINAL  Final    Lab Basic Metabolic Panel: Recent Labs  Lab 02/23/19 0330 02/24/19 0455 02/26/19 1330 02/26/19 1800 02/27/19 0447 02/27/19 1710 02/28/19 0412 02/28/19 1003 02/28/19 1511  NA 143  --  151* 149* 144 145 141 138 136  K 5.5*  --  4.0 3.8 4.3 4.2 4.5 4.3 3.8  CL 105   < > 96* 95* 92* 94* 92*  --   --   CO2 28   < > 42* 42* 43* 42* 41*  --   --   GLUCOSE 271*   < > 168* 109* 212* 110* 169*  --   --   BUN 63*   < > 73* 63* 63* 56* 55*  --   --   CREATININE 1.11   < > 0.83 0.71 0.76 0.60* 0.61  --   --   CALCIUM 8.5*   < > 8.5* 8.5* 8.8* 8.7* 8.6*  --   --   MG 2.0  --   --   --   --   --   --   --   --    < > = values in this interval not displayed.   Liver Function Tests: Recent Labs  Lab 02/23/19 0330 02/26/19 0540  AST 36 36  ALT 20 17  ALKPHOS 74 69  BILITOT 0.7 0.4  PROT 5.6* 5.5*  ALBUMIN 2.4* 2.5*   No results for input(s): LIPASE, AMYLASE in the last 168 hours. No results for input(s): AMMONIA in the last 168  hours. CBC: Recent Labs  Lab 02/23/19 0330 02/24/19 0635 02/25/19 0526 02/26/19 0540 02/26/19 1629 02/27/19 0447 02/28/19 0412 02/28/19 1003 02/28/19 1511  WBC 19.0* 13.3* 12.6* 10.1  --  11.4* 9.2  --   --   NEUTROABS 18.5*  --   --  8.9*  --   --  7.7  --   --   HGB 13.7 12.3* 12.2* 12.6* 12.6* 12.3* 11.1* 11.9* 12.2*  HCT 43.2 38.9* 38.1* 40.2 37.0* 39.0 35.7* 35.0* 36.0*  MCV 94.7 94.9 93.2 94.4  --  94.0 94.7  --   --   PLT 276 198 207 226  --  213 197  --   --    Cardiac Enzymes: No results for input(s): CKTOTAL, CKMB, CKMBINDEX, TROPONINI in the last 168 hours. Sepsis Labs: Recent Labs  Lab 02/25/19 0526 02/26/19 0540 02/27/19 0447 02/28/19 0412  WBC 12.6* 10.1 11.4* 9.2  Procedures/Operations  12/7 ETT   12/7 left subclavian CVL     Max Fickle 02/13/2019, 6:09 PM

## 2019-03-15 NOTE — Progress Notes (Signed)
Patient extubated for compassionate extubation.  Placed on RA.

## 2019-03-15 NOTE — Progress Notes (Signed)
[facility]  Attempted to call his wife to let her know that we extubated Andrew Friedman Could not get through  Roselie Awkward, MD [place] Pager: [phone] Cell: [phone] If no response, call [phone]

## 2019-03-15 NOTE — Progress Notes (Signed)
Compassionate extubation completed at 1650 by Stanton Kidney RT, pt death at 1718-07-14 with 2 nurse verification: Jamey Reas RN and Delorise Jackson RN. 2019-03-19 1720 Tawanna Sat, RN

## 2019-03-15 DEATH — deceased

## 2020-06-30 IMAGING — DX DG CHEST 1V PORT
1 series · 1 of 1 positions shown · non-contrast
Comparison: February 26, 2019.

CLINICAL DATA: Acute respiratory failure with hypoxia.

EXAM:
PORTABLE CHEST 1 VIEW

[chest ap]
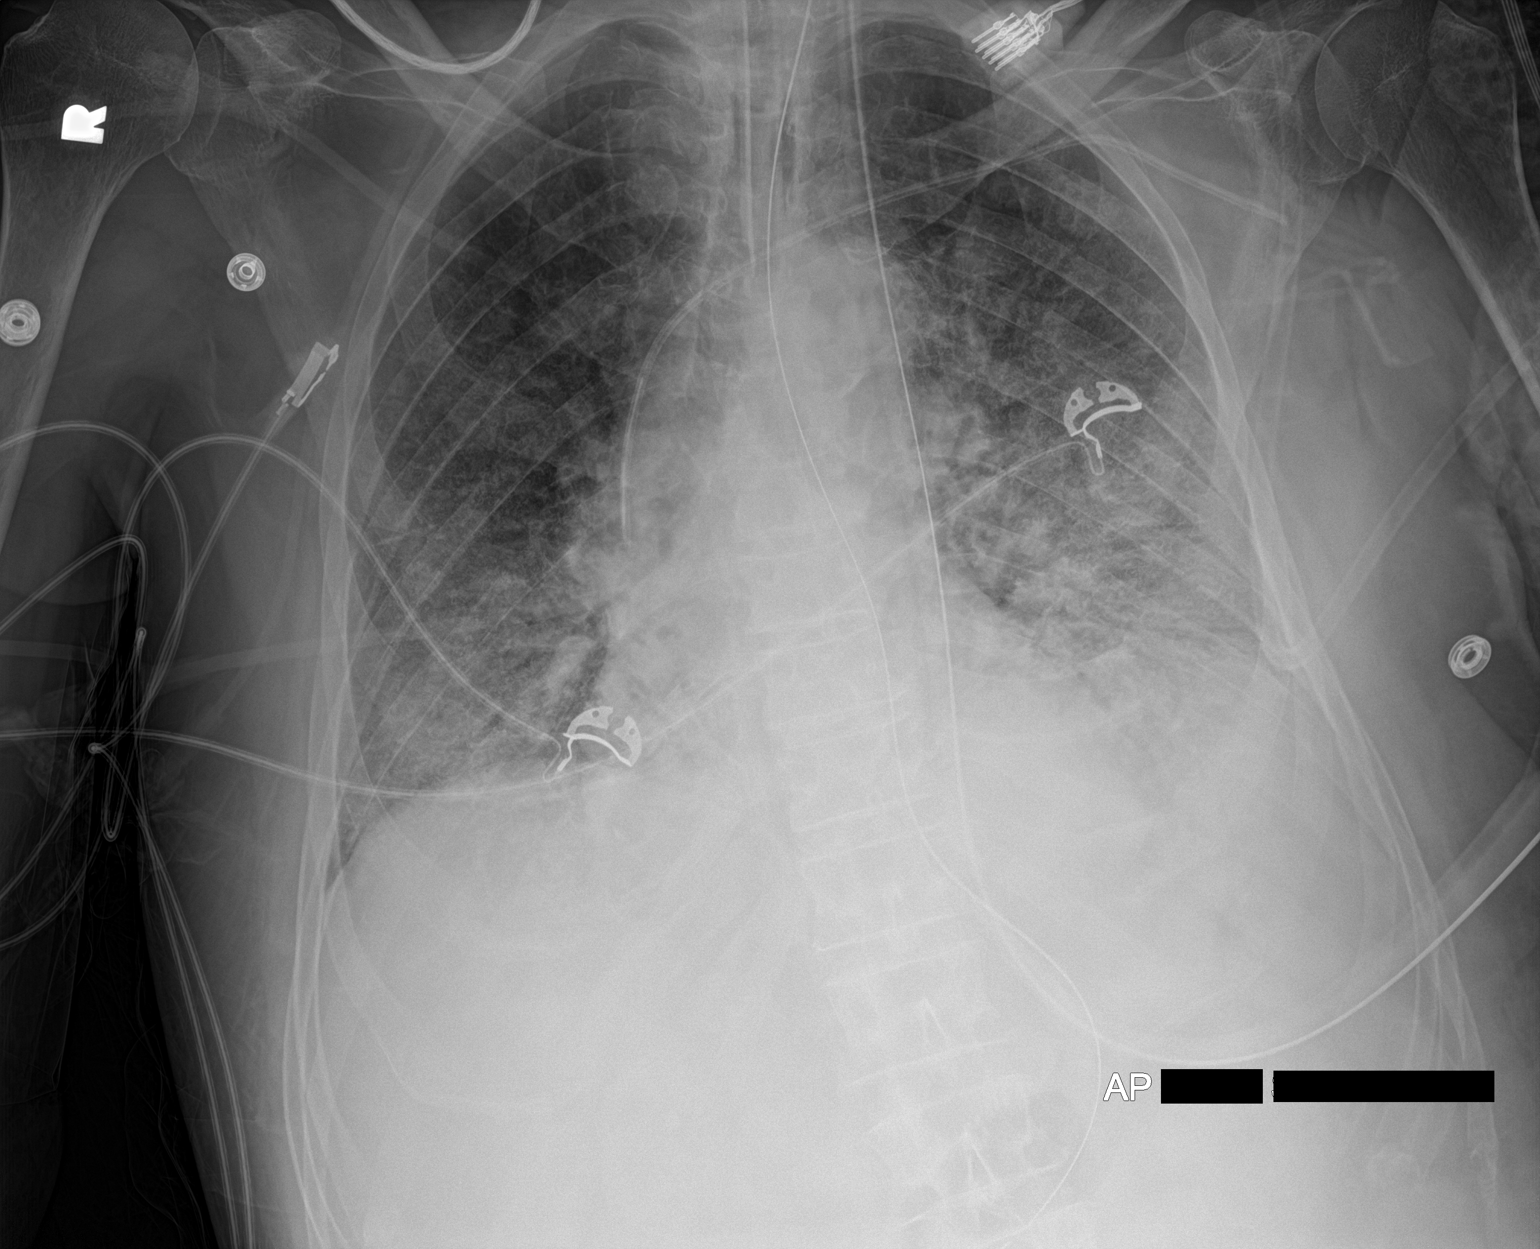

[1 of 1 positions shown; findings below may reference images not displayed]

FINDINGS: The heart size and mediastinal contours are within normal limits.
Endotracheal and nasogastric tubes are unchanged in position. No
pneumothorax is noted. Stable bilateral lung opacities are noted
concerning for pneumonia or edema. The visualized skeletal
structures are unremarkable.
IMPRESSION: Stable support apparatus. Stable bilateral lung opacities are noted
concerning for pneumonia or edema.
